# Patient Record
Sex: Male | Born: 1935 | Race: White | Hispanic: No | Marital: Married | Smoking: Never smoker
Health system: Southern US, Community
[De-identification: ages and names within clinical notes are randomized; demographics above are authoritative.]

## PROBLEM LIST (undated history)

## (undated) DIAGNOSIS — M1990 Unspecified osteoarthritis, unspecified site: Secondary | ICD-10-CM

## (undated) DIAGNOSIS — B019 Varicella without complication: Secondary | ICD-10-CM

## (undated) DIAGNOSIS — C44601 Unspecified malignant neoplasm of skin of unspecified upper limb, including shoulder: Secondary | ICD-10-CM

## (undated) DIAGNOSIS — R030 Elevated blood-pressure reading, without diagnosis of hypertension: Secondary | ICD-10-CM

## (undated) DIAGNOSIS — R51 Headache: Secondary | ICD-10-CM

## (undated) DIAGNOSIS — K635 Polyp of colon: Secondary | ICD-10-CM

## (undated) DIAGNOSIS — I10 Essential (primary) hypertension: Secondary | ICD-10-CM

## (undated) DIAGNOSIS — B029 Zoster without complications: Secondary | ICD-10-CM

## (undated) DIAGNOSIS — K219 Gastro-esophageal reflux disease without esophagitis: Secondary | ICD-10-CM

## (undated) HISTORY — DX: Varicella without complication: B01.9

## (undated) HISTORY — DX: Unspecified osteoarthritis, unspecified site: M19.90

## (undated) HISTORY — DX: Polyp of colon: K63.5

## (undated) HISTORY — DX: Gastro-esophageal reflux disease without esophagitis: K21.9

## (undated) HISTORY — DX: Unspecified malignant neoplasm of skin of unspecified upper limb, including shoulder: C44.601

## (undated) HISTORY — DX: Headache: R51

## (undated) HISTORY — DX: Zoster without complications: B02.9

## (undated) HISTORY — DX: Essential (primary) hypertension: I10

## (undated) HISTORY — DX: Elevated blood-pressure reading, without diagnosis of hypertension: R03.0

---

## 2004-04-29 ENCOUNTER — Ambulatory Visit: Payer: Self-pay | Admitting: Gastroenterology

## 2004-06-20 ENCOUNTER — Ambulatory Visit: Payer: Self-pay | Admitting: Gastroenterology

## 2004-06-24 ENCOUNTER — Ambulatory Visit: Payer: Self-pay | Admitting: Gastroenterology

## 2005-06-10 LAB — HM COLONOSCOPY

## 2006-05-04 ENCOUNTER — Ambulatory Visit: Payer: Self-pay | Admitting: Internal Medicine

## 2007-11-13 IMAGING — CT CT HEAD WITHOUT CONTRAST
2 series · 16 of 30 positions shown, 20 images · non-contrast
Comparison: none

REASON FOR EXAM: Confusion, dizziness and memory loss
COMMENTS:

[Series 2: without · axial · non-contrast · 0.43mm/px · z∈[+860,+986]mm · 13 of 31 slices shown, 17 images]
[im 3/31  brain]
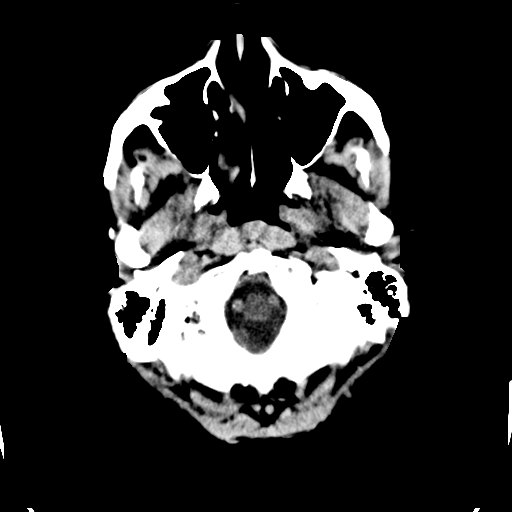
[im 3/31  bone]
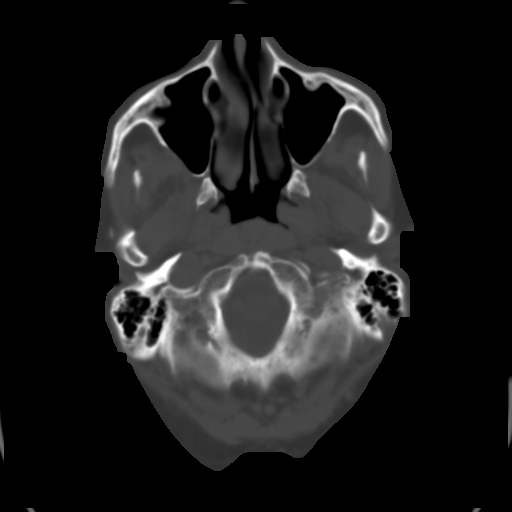
[im 5/31  brain]
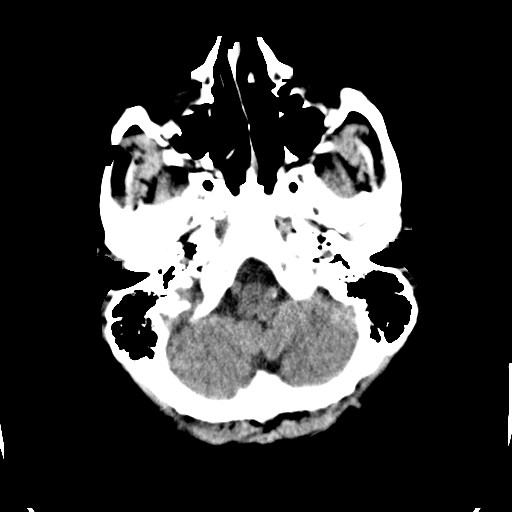
[im 7/31  brain]
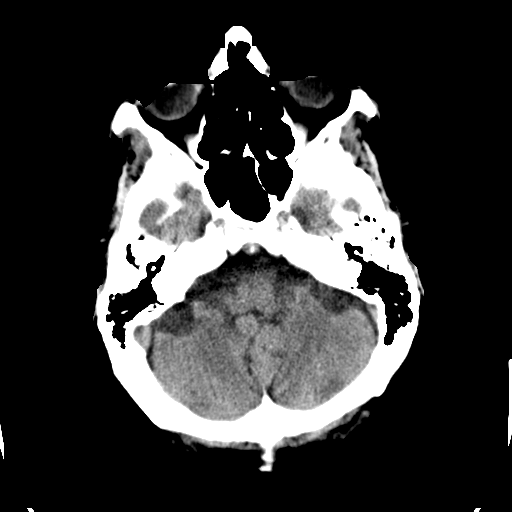
[im 9/31  brain]
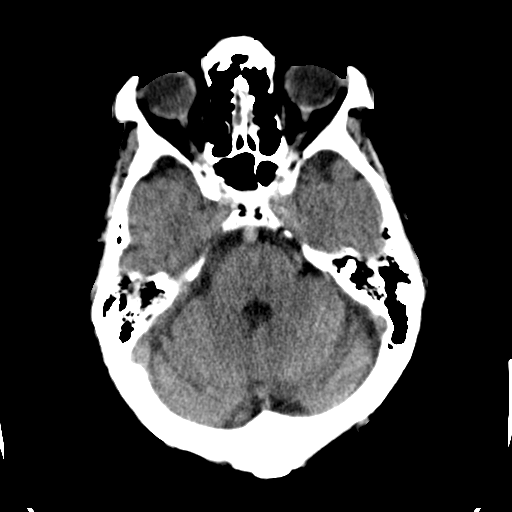
[im 11/31  brain]
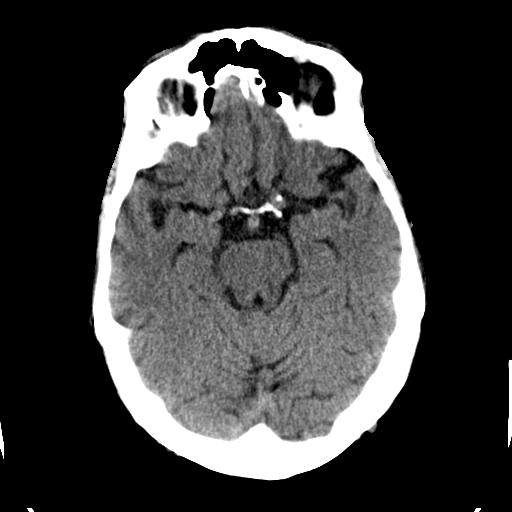
[im 11/31  bone]
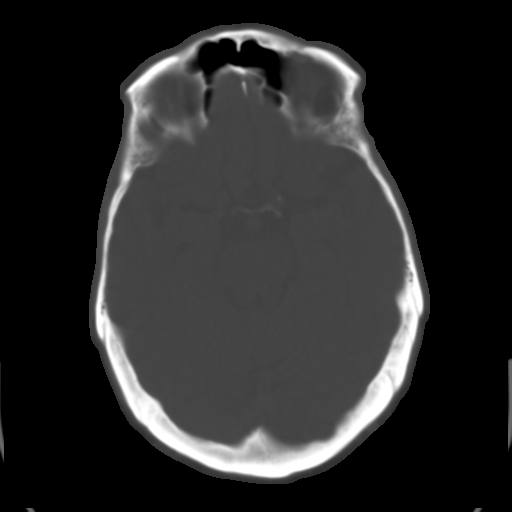
[im 13/31  brain]
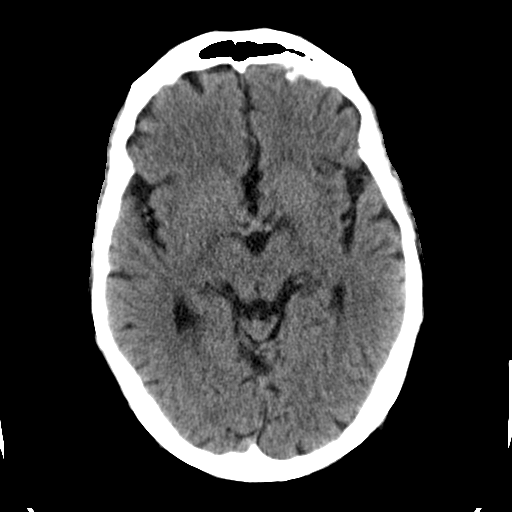
[im 16/31  brain]
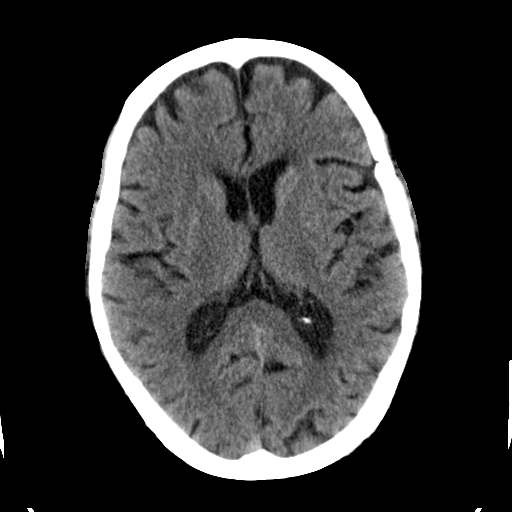
[im 18/31  brain]
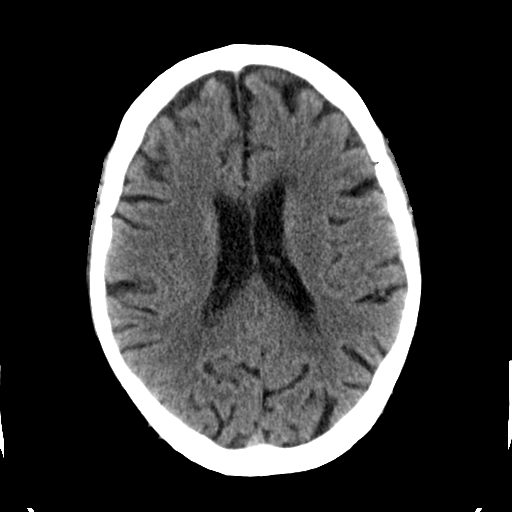
[im 20/31  brain]
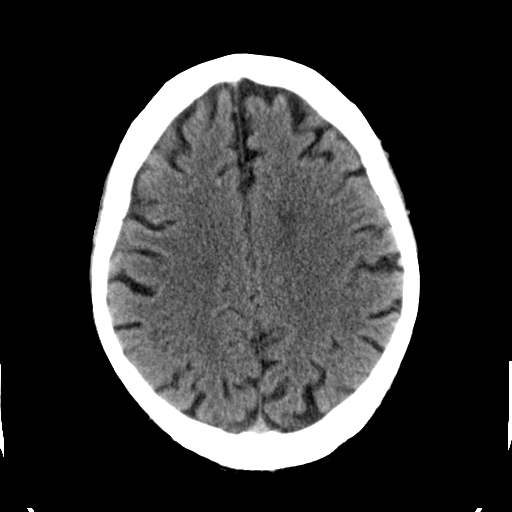
[im 20/31  bone]
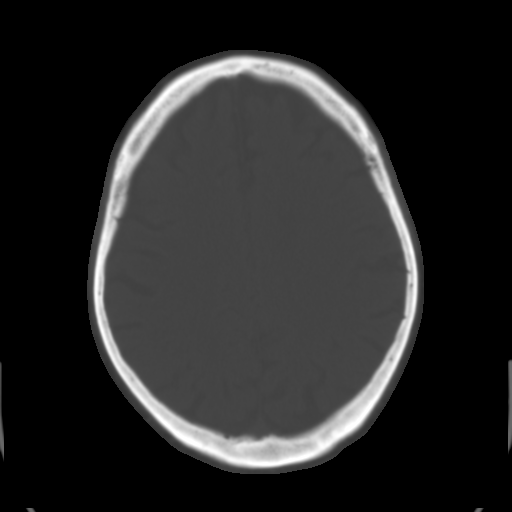
[im 22/31  brain]
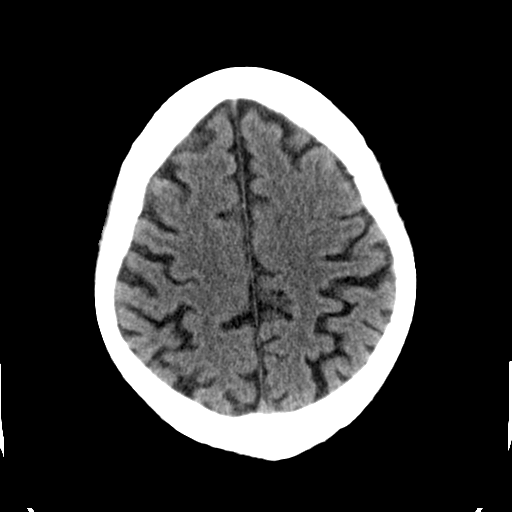
[im 24/31  brain]
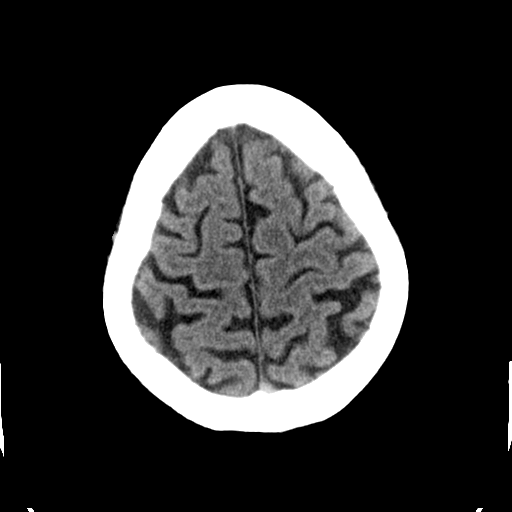
[im 26/31  brain]
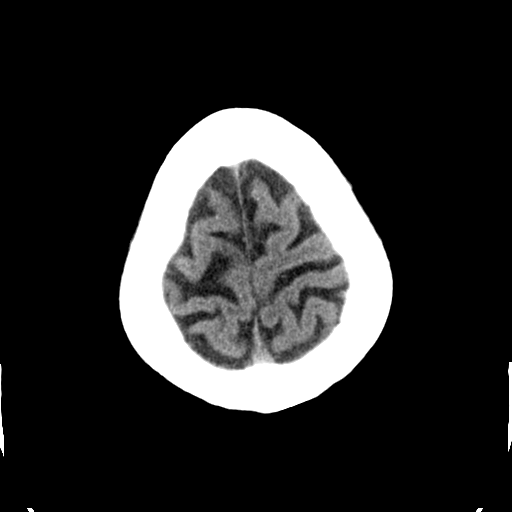
[im 28/31  brain]
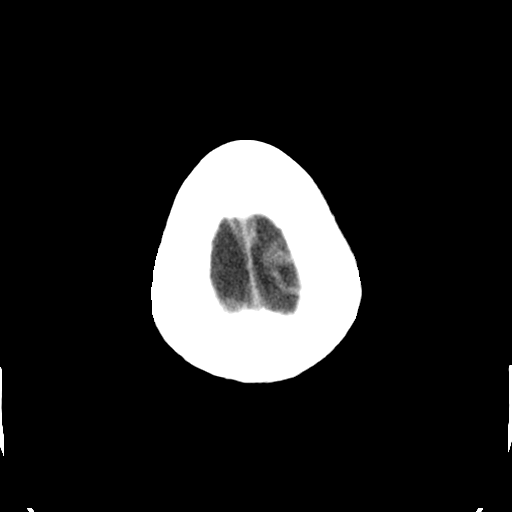
[im 28/31  bone]
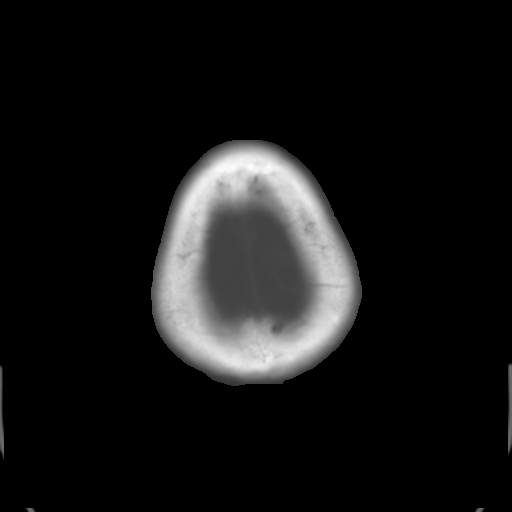

[Series 3: bone · axial · 0.43mm/px · z∈[+860,+900]mm · 3 of 31 slices shown]
[im 3/31  bone]
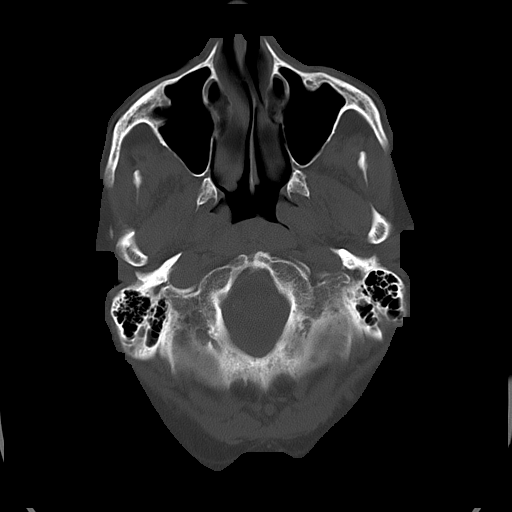
[im 7/31  bone]
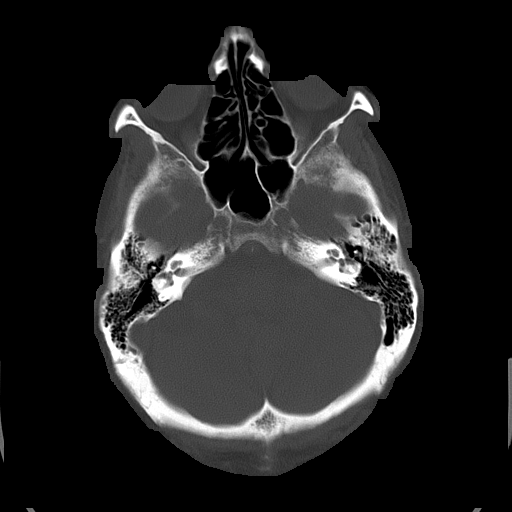
[im 11/31  bone]
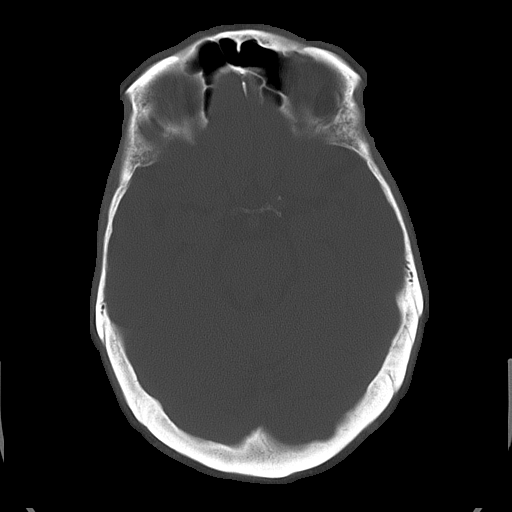

[16 of 30 positions shown; findings below may reference images not displayed]

PROCEDURE:     CT  - CT HEAD WITHOUT CONTRAST  - May 04, 2006  [DATE]

RESULT:      The patient is complaining of mental status change and memory
loss.

The ventricles are normal in size and position. There is moderate diffuse
cerebral and cerebellar atrophy present. Punctate basal ganglia
calcification is noted on the right. There is no evidence of intracranial
hemorrhage or acute evolving ischemic infarction. At bone window settings, I
see no lytic or blastic lesion. The observed portions of the paranasal
sinuses are clear.
IMPRESSION: 1.     I do not see evidence of an acute ischemic or hemorrhagic event.
2.     There are mild age-appropriate atrophic changes present.

## 2012-05-24 ENCOUNTER — Telehealth: Payer: Self-pay | Admitting: Internal Medicine

## 2012-05-24 NOTE — Telephone Encounter (Signed)
Patient wanting a prescription for a pneumonia vaccine faxed to [facility] on [address]. °

## 2012-05-24 NOTE — Telephone Encounter (Signed)
Patient notified . He stated that he will get it when he come for his visit in two weeks.

## 2012-05-24 NOTE — Telephone Encounter (Signed)
I HAVE NOT MET THIS PATIENT YET,  CANNO PRESCRIBE ANYTHING UNTIL HE HAS BEEN SEEN.

## 2012-06-07 ENCOUNTER — Encounter: Payer: Self-pay | Admitting: Internal Medicine

## 2012-06-07 ENCOUNTER — Ambulatory Visit (INDEPENDENT_AMBULATORY_CARE_PROVIDER_SITE_OTHER): Payer: Medicare Other | Admitting: Internal Medicine

## 2012-06-07 VITALS — BP 130/78 | HR 69 | Temp 97.7°F | Resp 12 | Ht 69.0 in | Wt 182.8 lb

## 2012-06-07 DIAGNOSIS — I10 Essential (primary) hypertension: Secondary | ICD-10-CM

## 2012-06-07 DIAGNOSIS — F411 Generalized anxiety disorder: Secondary | ICD-10-CM

## 2012-06-07 DIAGNOSIS — M1990 Unspecified osteoarthritis, unspecified site: Secondary | ICD-10-CM

## 2012-06-07 DIAGNOSIS — M129 Arthropathy, unspecified: Secondary | ICD-10-CM

## 2012-06-07 DIAGNOSIS — K219 Gastro-esophageal reflux disease without esophagitis: Secondary | ICD-10-CM

## 2012-06-07 MED ORDER — ALPRAZOLAM 0.25 MG PO TABS: 0.1250 mg | ORAL_TABLET | Freq: Two times a day (BID) | ORAL | Status: DC | PRN

## 2012-06-07 MED ORDER — HYDROCHLOROTHIAZIDE 25 MG PO TABS: 25.0000 mg | ORAL_TABLET | Freq: Every day | ORAL | Status: DC

## 2012-06-07 NOTE — Progress Notes (Signed)
Patient ID: Matthew Maxwell, male   DOB: 1920/04/08, 78 y.o.   MRN: [id number]     Patient Active Problem List  Diagnosis  . Hypertension  . Arthritis  . Skin cancer of arm  . GERD (gastroesophageal reflux disease)  . Generalized anxiety disorder    Subjective:  CC:   Chief Complaint  Patient presents with  . Establish Care    HPI:   Matthew Maxwell is a 78 y.o. male who presents as a new patient to establish primary care with the chief complaint of need for new physician .  He is a very healthy 78 year old male who is transferring from Dr Diona Fanti  Who has retired a year ago.  He has no acute issues.  Hees podiatrist  [place] for management of dystrophic toe nails.  He has chronic knee pain secondary to DJD and has had prior knee aspiration and injections with Synvisc both knees by Dr. Gavin Potters .  He has has a heart murmur which is nonprogressive and asymptomatic.  He has some hearing loss 0 to read lips been followed cauterizations easily. He has occasional headaches and reflux. His hypertension which is managed with minimal medications. He has a history of colon polyps. 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 He is accompanied by his wife and daughter. His wife has noticed very mild memory loss for short term items but he remains extremely functional and still manages the household finances without problem.   Past Medical History  Diagnosis Date  . Chicken pox   . Headache   . Elevated blood pressure reading   . Colon polyps   . Shingles   . Hypertension   . Arthritis   . Skin cancer of arm   . GERD (gastroesophageal reflux disease)     History reviewed. No pertinent past surgical history.  Family History  Problem Relation Age of Onset  . Hypertension Mother   . Heart disease Father     History   Social History  . Marital Status: Married    Spouse Name: N/A    Number of Children: N/A  . Years of Education: N/A   Occupational History  .  Not on file.   Social History Main Topics  . Smoking status: Never Smoker   . Smokeless tobacco: Not on file  . Alcohol Use: No  . Drug Use: Not on file  . Sexually Active: Not on file   Other Topics Concern  . Not on file   Social History Narrative  . No narrative on file    No Known Allergies   Review of Systems:   The remainder of the review of systems was negative except those addressed in the HPI.    Objective:  BP 130/78  Pulse 69  Temp 97.7 F (36.5 C) (Oral)  Resp 12  Ht 5\' 9"  (1.753 m)  Wt 182 lb 12 oz (82.895 kg)  BMI 26.99 kg/m2  SpO2 95%  General appearance: alert, cooperative and appears stated age Ears: normal TM's and external ear canals both ears Throat: lips, mucosa, and tongue normal; teeth and gums normal Neck: no adenopathy, no carotid bruit, supple, symmetrical, trachea midline and thyroid not enlarged, symmetric, no tenderness/mass/nodules Back: symmetric, no curvature. ROM normal. No CVA tenderness. Lungs: clear to auscultation bilaterally Heart: regular rate and rhythm, S1, S2 normal, no murmur, click, rub or gallop Abdomen: soft, non-tender; bowel sounds normal; no masses,  no organomegaly Pulses: 2+ and  symmetric Skin: Skin color, texture, turgor normal. No rashes or lesions Lymph nodes: Cervical, supraclavicular, and axillary nodes normal.  Assessment and Plan:  Hypertension Well controlled on current regimen. Renal function and electrolytes are going to be checked at the [facility] next week and they will send me copies.  no changes today.  GERD (gastroesophageal reflux disease) Symptoms are controlled with daily use of omeprazole.  Arthritis Symptoms are minimal and managed with over-the-counter Tylenol as needed. He walks with a walking stick and has had no recent history of falls or weakness.  Generalized anxiety disorder Managed with stable doses of venlafaxine and when necessary low dose alprazolam for years. No changes  today.   Updated Medication List Outpatient Encounter Prescriptions as of 06/07/2012  Medication Sig Dispense Refill  . ALPRAZolam (XANAX) 0.25 MG tablet Take 0.5 tablets (0.125 mg total) by mouth 2 (two) times daily as needed.  30 tablet  5  . hydrochlorothiazide (HYDRODIURIL) 25 MG tablet Take 1 tablet (25 mg total) by mouth daily.  90 tablet  3  . omeprazole (PRILOSEC) 20 MG capsule Take 20 mg by mouth daily.      Marland Kitchen venlafaxine (EFFEXOR) 75 MG tablet Take 75 mg by mouth 2 (two) times daily.      . [DISCONTINUED] ALPRAZolam (XANAX) 0.25 MG tablet Take 0.125 mg by mouth 2 (two) times daily as needed.       . [DISCONTINUED] hydrochlorothiazide (HYDRODIURIL) 25 MG tablet Take 25 mg by mouth daily.         No orders of the defined types were placed in this encounter.    No Follow-up on file.

## 2012-06-07 NOTE — Patient Instructions (Addendum)
Please have thyroid,  Liver and kidney function checked at the [place] and send to me.    We will fax to [facility] your prescriptions

## 2012-06-10 ENCOUNTER — Encounter: Payer: Self-pay | Admitting: Internal Medicine

## 2012-06-10 DIAGNOSIS — F411 Generalized anxiety disorder: Secondary | ICD-10-CM | POA: Insufficient documentation

## 2012-06-10 DIAGNOSIS — K219 Gastro-esophageal reflux disease without esophagitis: Secondary | ICD-10-CM | POA: Insufficient documentation

## 2012-06-10 DIAGNOSIS — C44601 Unspecified malignant neoplasm of skin of unspecified upper limb, including shoulder: Secondary | ICD-10-CM | POA: Insufficient documentation

## 2012-06-10 DIAGNOSIS — I10 Essential (primary) hypertension: Secondary | ICD-10-CM | POA: Insufficient documentation

## 2012-06-10 DIAGNOSIS — M1990 Unspecified osteoarthritis, unspecified site: Secondary | ICD-10-CM | POA: Insufficient documentation

## 2012-06-10 NOTE — Assessment & Plan Note (Signed)
Symptoms are minimal and managed with over-the-counter Tylenol as needed. He walks with a walking stick and has had no recent history of falls or weakness.

## 2012-06-10 NOTE — Assessment & Plan Note (Signed)
Symptoms are controlled with daily use of omeprazole.

## 2012-06-10 NOTE — Assessment & Plan Note (Signed)
Managed with stable doses of venlafaxine and when necessary low dose alprazolam for years. No changes today.

## 2012-06-10 NOTE — Assessment & Plan Note (Signed)
Well controlled on current regimen. Renal function intellectual H. need to be checked.  no changes today.

## 2012-09-21 ENCOUNTER — Telehealth: Payer: Self-pay | Admitting: Internal Medicine

## 2012-09-21 NOTE — Telephone Encounter (Signed)
FYI Spouse called wanting to make an appointment with dr Darrick Huntsman for Matthew Maxwell She stated when he was doing PT therapist thought he heard irregular heart beat.  pt spouse refused to talk to triage nurse/rbh Made appointment appointment with raquel rey Friday 2/28 @ 1:30

## 2012-09-23 ENCOUNTER — Ambulatory Visit (INDEPENDENT_AMBULATORY_CARE_PROVIDER_SITE_OTHER): Payer: Medicare Other | Admitting: Adult Health

## 2012-09-23 ENCOUNTER — Encounter: Payer: Self-pay | Admitting: Adult Health

## 2012-09-23 VITALS — BP 127/64 | HR 81 | Temp 98.1°F | Resp 14 | Ht 67.0 in | Wt 189.0 lb

## 2012-09-23 DIAGNOSIS — I499 Cardiac arrhythmia, unspecified: Secondary | ICD-10-CM | POA: Insufficient documentation

## 2012-09-23 NOTE — Assessment & Plan Note (Addendum)
EKG done.  Patient is in sinus rhythm with PACs. Leads III and aVF showing inverted T waves. No acute changes. Patient is asymptomatic. Instructed to report any CP, shortness of breath, syncope or other bothersome symptoms.

## 2012-09-23 NOTE — Progress Notes (Signed)
  Subjective:    Patient ID: Matthew Maxwell, male    DOB: 1919-11-25, 79 y.o.   MRN: [id number]  HPI  Patient is a pleasant 79 y/o male who presents to clinic for irregular heart beat. Patient's wife reports that pt saw a provider at the [place] who ordered physical therapy for evaluation and treatment of patient's balance. Wife further reports that she really does not understand why physical therapy was ordered in the first place. Patient has been doing fine. She reports that during the last visit provided by his physical therapist patient was noted to have an irregular heartbeat. Patient is here today for evaluation of same. Patient denies any symptoms. He denies cp, shortness of breath, syncope, lightheadedness. He has chronic problems with dizzy spells but he reports these have been ongoing since his early adult years. Patient and wife are both very jovial during the entire visit. Patient even comments "are you trying to find something wrong with me?"   Current Outpatient Prescriptions on File Prior to Visit  Medication Sig Dispense Refill  . hydrochlorothiazide (HYDRODIURIL) 25 MG tablet Take 1 tablet (25 mg total) by mouth daily.  90 tablet  3  . omeprazole (PRILOSEC) 20 MG capsule Take 20 mg by mouth daily.      Marland Kitchen venlafaxine (EFFEXOR) 75 MG tablet Take 75 mg by mouth 2 (two) times daily.      Marland Kitchen ALPRAZolam (XANAX) 0.25 MG tablet Take 0.5 tablets (0.125 mg total) by mouth 2 (two) times daily as needed.  30 tablet  5   No current facility-administered medications on file prior to visit.     Review of Systems  HENT: Ear pain: .vs.   Respiratory: Negative for chest tightness, shortness of breath and wheezing.   Cardiovascular: Negative for chest pain, palpitations and leg swelling.  Neurological: Positive for dizziness. Negative for syncope, light-headedness, numbness and headaches.  Psychiatric/Behavioral: Negative.     BP 127/64  Pulse 81  Temp(Src) 98.1 F (36.7 C) (Oral)  Resp 14  Ht  5\' 7"  (1.702 m)  Wt 189 lb (85.73 kg)  BMI 29.59 kg/m2  SpO2 98%     Objective:   Physical Exam  Constitutional: He is oriented to person, place, and time. He appears well-developed and well-nourished. No distress.  Cardiovascular: Normal rate and intact distal pulses.   Murmur heard. Irregular rhythm.  Pulmonary/Chest: Effort normal and breath sounds normal. No respiratory distress. He has no wheezes. He has no rales.  Neurological: He is alert and oriented to person, place, and time.  Psychiatric: He has a normal mood and affect. His behavior is normal. Judgment and thought content normal.          Assessment & Plan:

## 2012-12-09 ENCOUNTER — Ambulatory Visit: Payer: Medicare Other | Admitting: Internal Medicine

## 2012-12-14 ENCOUNTER — Ambulatory Visit: Payer: Medicare Other | Admitting: Internal Medicine

## 2014-04-16 ENCOUNTER — Ambulatory Visit: Payer: Self-pay | Admitting: Otolaryngology

## 2014-06-13 ENCOUNTER — Ambulatory Visit: Payer: Self-pay | Admitting: Ophthalmology

## 2014-06-13 LAB — POTASSIUM: Potassium: 4.5 mmol/L (ref 3.5–5.1)

## 2014-07-03 ENCOUNTER — Ambulatory Visit: Payer: Self-pay | Admitting: Ophthalmology

## 2014-07-24 ENCOUNTER — Ambulatory Visit: Payer: Self-pay | Admitting: Ophthalmology

## 2014-11-17 NOTE — Op Note (Signed)
PATIENT NAME:  Matthew Maxwell, Matthew Maxwell MR#:  [id number] DATE OF BIRTH:  Apr 10, 1920  DATE OF PROCEDURE:  07/03/2014  PREOPERATIVE DIAGNOSIS:  Nuclear sclerotic cataract of the right eye.   POSTOPERATIVE DIAGNOSIS:  Nuclear sclerotic cataract of the right eye.   OPERATIVE PROCEDURE:  Cataract extraction by phacoemulsification with implant of intraocular lens to right eye.   SURGEON:  Birder Robson, MD.   ANESTHESIA:  1. Managed anesthesia care.  2. Topical tetracaine drops followed by 2% Xylocaine jelly applied in the preoperative holding area.   COMPLICATIONS:  None.   TECHNIQUE:   Stop and chop.  DESCRIPTION OF PROCEDURE:  The patient was examined and consented in the preoperative holding area where the aforementioned topical anesthesia was applied to the right eye and then brought back to the Operating Room where the right eye was prepped and draped in the usual sterile ophthalmic fashion and a lid speculum was placed. A paracentesis was created with the side port blade and the anterior chamber was filled with viscoelastic. A near clear corneal incision was performed with the steel keratome. A continuous curvilinear capsulorrhexis was performed with a cystotome followed by the capsulorrhexis forceps. Hydrodissection and hydrodelineation were carried out with BSS on a blunt cannula. The lens was removed in a stop and chop technique and the remaining cortical material was removed with the irrigation-aspiration handpiece. The capsular bag was inflated with viscoelastic and the Tecnis ZCB00, 20.5-diopter lens, serial number [id number] was placed in the capsular bag without complication. The remaining viscoelastic was removed from the eye with the irrigation-aspiration handpiece. The wounds were hydrated. The anterior chamber was flushed with Miostat and the eye was inflated to physiologic pressure. 0.1 mL of cefuroxime concentration 10 mg/mL was placed in the anterior chamber. The wounds were found to be  water tight. The eye was dressed with Vigamox. The patient was given protective glasses to wear throughout the day and a shield with which to sleep tonight. The patient was also given drops with which to begin a drop regimen today and will follow-up with me in one day.  TOTAL ULTRASOUND TIME: Three minutes 24 seconds with a CDE of 52.13.   ____________________________ Livingston Diones. Dayne Chait, MD wlp:TT D: 07/03/2014 21:22:19 ET T: 07/03/2014 23:30:14 ET JOB#: [id number]  cc: Nichalos L. Atalia Litzinger, MD, <Dictator> Livingston Diones Murphy Duzan MD ELECTRONICALLY SIGNED 07/05/2014 10:37

## 2014-11-21 NOTE — Op Note (Signed)
PATIENT NAME:  Matthew Maxwell, Matthew Maxwell MR#:  [id number] DATE OF BIRTH:  June 14, 1920  DATE OF PROCEDURE:  07/24/2014  PREOPERATIVE DIAGNOSIS:  Nuclear sclerotic cataract of the left eye.   POSTOPERATIVE DIAGNOSIS:  Nuclear sclerotic cataract of the eye.   OPERATIVE PROCEDURE:  Cataract extraction by phacoemulsification with implant of intraocular lens to eye.   SURGEON:  Birder Robson, MD.   ANESTHESIA:  1. Managed anesthesia care.  2. Topical tetracaine drops followed by 2% Xylocaine jelly applied in the preoperative holding area.   COMPLICATIONS:  None.   TECHNIQUE:   Stop and chop.  DESCRIPTION OF PROCEDURE:  The patient was examined and consented in the preoperative holding area where the aforementioned topical anesthesia was applied to the left eye and then brought back to the Operating Room where the left eye was prepped and draped in the usual sterile ophthalmic fashion and a lid speculum was placed. A paracentesis was created with the side port blade and the anterior chamber was filled with viscoelastic. A near clear corneal incision was performed with the steel keratome. A continuous curvilinear capsulorrhexis was performed with a cystotome followed by the capsulorrhexis forceps. Hydrodissection and hydrodelineation were carried out with BSS on a blunt cannula. The lens was removed in a stop and chop technique and the remaining cortical material was removed with the irrigation-aspiration handpiece. The capsular bag was inflated with viscoelastic and the Tecnis ZCB00, 20.0-diopter lens, serial number [id number] was placed in the capsular bag without complication. The remaining viscoelastic was removed from the eye with the irrigation-aspiration handpiece. The wounds were hydrated. The anterior chamber was flushed with Miostat and the eye was inflated to physiologic pressure. 0.1 mL of cefuroxime concentration 10 mg/mL was placed in the anterior chamber. The wounds were found to be water tight.  The eye was dressed with Vigamox. The patient was given protective glasses to wear throughout the day and a shield with which to sleep tonight. The patient was also given drops with which to begin a drop regimen today and will follow-up with me in one day.    ____________________________ Livingston Diones. Khrystian Schauf, MD wlp:at D: 07/24/2014 13:54:42 ET T: 07/24/2014 15:33:22 ET JOB#: [id number]  cc: Jaelynn L. Kidada Ging, MD, <Dictator> Livingston Diones Antoin Dargis MD ELECTRONICALLY SIGNED 07/30/2014 9:18

## 2021-02-23 ENCOUNTER — Emergency Department: Payer: Medicare Other

## 2021-02-23 ENCOUNTER — Other Ambulatory Visit: Payer: Self-pay

## 2021-02-23 ENCOUNTER — Inpatient Hospital Stay: Payer: Medicare Other

## 2021-02-23 DIAGNOSIS — J9601 Acute respiratory failure with hypoxia: Secondary | ICD-10-CM | POA: Diagnosis not present

## 2021-02-23 DIAGNOSIS — I214 Non-ST elevation (NSTEMI) myocardial infarction: Principal | ICD-10-CM

## 2021-02-23 DIAGNOSIS — F0391 Unspecified dementia with behavioral disturbance: Secondary | ICD-10-CM | POA: Diagnosis present

## 2021-02-23 DIAGNOSIS — R945 Abnormal results of liver function studies: Secondary | ICD-10-CM

## 2021-02-23 DIAGNOSIS — Z8249 Family history of ischemic heart disease and other diseases of the circulatory system: Secondary | ICD-10-CM | POA: Diagnosis not present

## 2021-02-23 DIAGNOSIS — N179 Acute kidney failure, unspecified: Secondary | ICD-10-CM

## 2021-02-23 DIAGNOSIS — Z8719 Personal history of other diseases of the digestive system: Secondary | ICD-10-CM

## 2021-02-23 DIAGNOSIS — I4891 Unspecified atrial fibrillation: Secondary | ICD-10-CM

## 2021-02-23 DIAGNOSIS — F418 Other specified anxiety disorders: Secondary | ICD-10-CM | POA: Diagnosis present

## 2021-02-23 DIAGNOSIS — Z85828 Personal history of other malignant neoplasm of skin: Secondary | ICD-10-CM

## 2021-02-23 DIAGNOSIS — Z66 Do not resuscitate: Secondary | ICD-10-CM | POA: Diagnosis present

## 2021-02-23 DIAGNOSIS — E860 Dehydration: Secondary | ICD-10-CM | POA: Diagnosis present

## 2021-02-23 DIAGNOSIS — D72829 Elevated white blood cell count, unspecified: Secondary | ICD-10-CM | POA: Diagnosis present

## 2021-02-23 DIAGNOSIS — W1830XA Fall on same level, unspecified, initial encounter: Secondary | ICD-10-CM | POA: Diagnosis present

## 2021-02-23 DIAGNOSIS — I10 Essential (primary) hypertension: Secondary | ICD-10-CM

## 2021-02-23 DIAGNOSIS — R7989 Other specified abnormal findings of blood chemistry: Secondary | ICD-10-CM | POA: Diagnosis present

## 2021-02-23 DIAGNOSIS — K219 Gastro-esophageal reflux disease without esophagitis: Secondary | ICD-10-CM

## 2021-02-23 DIAGNOSIS — I5021 Acute systolic (congestive) heart failure: Secondary | ICD-10-CM | POA: Diagnosis present

## 2021-02-23 DIAGNOSIS — M1990 Unspecified osteoarthritis, unspecified site: Secondary | ICD-10-CM | POA: Diagnosis present

## 2021-02-23 DIAGNOSIS — Z7982 Long term (current) use of aspirin: Secondary | ICD-10-CM | POA: Diagnosis not present

## 2021-02-23 DIAGNOSIS — R0602 Shortness of breath: Secondary | ICD-10-CM

## 2021-02-23 DIAGNOSIS — U071 COVID-19: Secondary | ICD-10-CM | POA: Diagnosis present

## 2021-02-23 DIAGNOSIS — G9341 Metabolic encephalopathy: Secondary | ICD-10-CM | POA: Diagnosis present

## 2021-02-23 DIAGNOSIS — E785 Hyperlipidemia, unspecified: Secondary | ICD-10-CM | POA: Diagnosis present

## 2021-02-23 DIAGNOSIS — F32A Depression, unspecified: Secondary | ICD-10-CM | POA: Diagnosis present

## 2021-02-23 DIAGNOSIS — J690 Pneumonitis due to inhalation of food and vomit: Secondary | ICD-10-CM | POA: Diagnosis not present

## 2021-02-23 DIAGNOSIS — Z79890 Hormone replacement therapy (postmenopausal): Secondary | ICD-10-CM

## 2021-02-23 DIAGNOSIS — F411 Generalized anxiety disorder: Secondary | ICD-10-CM | POA: Diagnosis present

## 2021-02-23 DIAGNOSIS — N1831 Chronic kidney disease, stage 3a: Secondary | ICD-10-CM | POA: Diagnosis present

## 2021-02-23 DIAGNOSIS — I255 Ischemic cardiomyopathy: Secondary | ICD-10-CM | POA: Diagnosis present

## 2021-02-23 DIAGNOSIS — Z515 Encounter for palliative care: Secondary | ICD-10-CM

## 2021-02-23 DIAGNOSIS — R0681 Apnea, not elsewhere classified: Secondary | ICD-10-CM | POA: Diagnosis present

## 2021-02-23 DIAGNOSIS — Z885 Allergy status to narcotic agent status: Secondary | ICD-10-CM

## 2021-02-23 DIAGNOSIS — I471 Supraventricular tachycardia: Secondary | ICD-10-CM | POA: Diagnosis not present

## 2021-02-23 DIAGNOSIS — E039 Hypothyroidism, unspecified: Secondary | ICD-10-CM

## 2021-02-23 DIAGNOSIS — Z79899 Other long term (current) drug therapy: Secondary | ICD-10-CM

## 2021-02-23 DIAGNOSIS — I130 Hypertensive heart and chronic kidney disease with heart failure and stage 1 through stage 4 chronic kidney disease, or unspecified chronic kidney disease: Secondary | ICD-10-CM | POA: Diagnosis present

## 2021-02-23 DIAGNOSIS — W19XXXA Unspecified fall, initial encounter: Secondary | ICD-10-CM | POA: Diagnosis not present

## 2021-02-23 DIAGNOSIS — R112 Nausea with vomiting, unspecified: Secondary | ICD-10-CM | POA: Diagnosis present

## 2021-02-23 DIAGNOSIS — Z888 Allergy status to other drugs, medicaments and biological substances status: Secondary | ICD-10-CM

## 2021-02-23 LAB — COMPREHENSIVE METABOLIC PANEL
ALT: 106 U/L — ABNORMAL HIGH (ref 0–44)
AST: 145 U/L — ABNORMAL HIGH (ref 15–41)
Albumin: 3.5 g/dL (ref 3.5–5.0)
Alkaline Phosphatase: 71 U/L (ref 38–126)
Anion gap: 15 (ref 5–15)
BUN: 56 mg/dL — ABNORMAL HIGH (ref 8–23)
CO2: 21 mmol/L — ABNORMAL LOW (ref 22–32)
Calcium: 8.9 mg/dL (ref 8.9–10.3)
Chloride: 105 mmol/L (ref 98–111)
Creatinine, Ser: 2.11 mg/dL — ABNORMAL HIGH (ref 0.61–1.24)
GFR, Estimated: 28 mL/min — ABNORMAL LOW (ref >60)
Glucose, Bld: 136 mg/dL — ABNORMAL HIGH (ref 70–99)
Potassium: 5 mmol/L (ref 3.5–5.1)
Sodium: 141 mmol/L (ref 135–145)
Total Bilirubin: 2.2 mg/dL — ABNORMAL HIGH (ref 0.3–1.2)
Total Protein: 6.6 g/dL (ref 6.5–8.1)

## 2021-02-23 LAB — BRAIN NATRIURETIC PEPTIDE: B Natriuretic Peptide: 2706 pg/mL — ABNORMAL HIGH (ref 0.0–100.0)

## 2021-02-23 LAB — PROTIME-INR
INR: 1.4 — ABNORMAL HIGH (ref 0.8–1.2)
Prothrombin Time: 17.3 s — ABNORMAL HIGH (ref 11.4–15.2)

## 2021-02-23 LAB — RESP PANEL BY RT-PCR (FLU A&B, COVID) ARPGX2
Influenza A by PCR: NEGATIVE
Influenza B by PCR: NEGATIVE
SARS Coronavirus 2 by RT PCR: POSITIVE — AB

## 2021-02-23 LAB — TROPONIN I (HIGH SENSITIVITY)
Troponin I (High Sensitivity): 1589 ng/L (ref <18)
Troponin I (High Sensitivity): 1815 ng/L (ref <18)
Troponin I (High Sensitivity): 2780 ng/L

## 2021-02-23 LAB — APTT: aPTT: 29 seconds (ref 24–36)

## 2021-02-23 LAB — CBC
HCT: 37.5 % — ABNORMAL LOW (ref 39.0–52.0)
Hemoglobin: 12.2 g/dL — ABNORMAL LOW (ref 13.0–17.0)
MCH: 32.9 pg (ref 26.0–34.0)
MCHC: 32.5 g/dL (ref 30.0–36.0)
MCV: 101.1 fL — ABNORMAL HIGH (ref 80.0–100.0)
Platelets: 206 10*3/uL (ref 150–400)
RBC: 3.71 MIL/uL — ABNORMAL LOW (ref 4.22–5.81)
RDW: 14.6 % (ref 11.5–15.5)
WBC: 11.3 10*3/uL — ABNORMAL HIGH (ref 4.0–10.5)
nRBC: 0.9 % — ABNORMAL HIGH (ref 0.0–0.2)

## 2021-02-23 LAB — HEPARIN LEVEL (UNFRACTIONATED): Heparin Unfractionated: <0.1 [IU]/mL — ABNORMAL LOW (ref 0.30–0.70)

## 2021-02-23 LAB — AMMONIA: Ammonia: 19 umol/L (ref 9–35)

## 2021-02-23 LAB — HEMOGLOBIN A1C
Hgb A1c MFr Bld: 5.9 % — ABNORMAL HIGH (ref 4.8–5.6)
Mean Plasma Glucose: 122.63 mg/dL

## 2021-02-23 LAB — MAGNESIUM: Magnesium: 2.7 mg/dL — ABNORMAL HIGH (ref 1.7–2.4)

## 2021-02-23 MED ORDER — IRON-VITAMIN C 65-125 MG PO TABS: ORAL_TABLET | Freq: Every day | ORAL | Status: DC

## 2021-02-23 MED ORDER — HEPARIN (PORCINE) 25000 UT/250ML-% IV SOLN
1100.0000 [IU]/h | INTRAVENOUS | Status: DC
  Administered 2021-02-23: 900 [IU]/h via INTRAVENOUS
  Filled 2021-02-23: qty 250

## 2021-02-23 MED ORDER — FUROSEMIDE 10 MG/ML IJ SOLN
40.0000 mg | Freq: Once | INTRAMUSCULAR | Status: AC
  Administered 2021-02-23: 40 mg via INTRAVENOUS
  Filled 2021-02-23: qty 4

## 2021-02-23 MED ORDER — LORAZEPAM 2 MG/ML IJ SOLN
INTRAMUSCULAR | Status: AC
  Administered 2021-02-23: 2 mg via INTRAMUSCULAR
  Filled 2021-02-23: qty 1

## 2021-02-23 MED ORDER — VITAMIN D3 25 MCG (1000 UNIT) PO TABS
2000.0000 [IU] | ORAL_TABLET | Freq: Every day | ORAL | Status: DC
  Filled 2021-02-23 (×3): qty 2

## 2021-02-23 MED ORDER — LEVOTHYROXINE SODIUM 50 MCG PO TABS: 75.0000 ug | ORAL_TABLET | Freq: Every day | ORAL | Status: DC

## 2021-02-23 MED ORDER — B COMPLEX-C PO TABS
1.0000 | ORAL_TABLET | Freq: Every day | ORAL | Status: DC
  Filled 2021-02-23 (×3): qty 1

## 2021-02-23 MED ORDER — DM-GUAIFENESIN ER 30-600 MG PO TB12
1.0000 | ORAL_TABLET | Freq: Two times a day (BID) | ORAL | Status: DC | PRN
  Administered 2021-02-23: 1 via ORAL
  Filled 2021-02-23: qty 1

## 2021-02-23 MED ORDER — B COMPLEX VITAMINS PO CAPS: 1.0000 | ORAL_CAPSULE | Freq: Every day | ORAL | Status: DC

## 2021-02-23 MED ORDER — KETOCONAZOLE 2 % EX SHAM
1.0000 "application " | MEDICATED_SHAMPOO | CUTANEOUS | Status: DC
  Filled 2021-02-23: qty 120

## 2021-02-23 MED ORDER — FERROUS SULFATE 325 (65 FE) MG PO TABS: 325.0000 mg | ORAL_TABLET | Freq: Every day | ORAL | Status: DC

## 2021-02-23 MED ORDER — CYANOCOBALAMIN 500 MCG PO LOZG: 500.0000 ug | LOZENGE | Freq: Every day | ORAL | Status: DC

## 2021-02-23 MED ORDER — MIRTAZAPINE 15 MG PO TABS
15.0000 mg | ORAL_TABLET | Freq: Every day | ORAL | Status: DC
  Administered 2021-02-23: 15 mg via ORAL
  Filled 2021-02-23: qty 1

## 2021-02-23 MED ORDER — HYDROXYZINE HCL 50 MG/ML IM SOLN
25.0000 mg | Freq: Four times a day (QID) | INTRAMUSCULAR | Status: DC | PRN
  Administered 2021-02-23: 25 mg via INTRAMUSCULAR
  Filled 2021-02-23 (×3): qty 0.5

## 2021-02-23 MED ORDER — SALINE SPRAY 0.65 % NA SOLN
2.0000 | Freq: Three times a day (TID) | NASAL | Status: DC
  Administered 2021-02-25: 2 via NASAL
  Filled 2021-02-23: qty 44

## 2021-02-23 MED ORDER — ZINC SULFATE 220 (50 ZN) MG PO CAPS: 440.0000 mg | ORAL_CAPSULE | Freq: Every day | ORAL | Status: DC

## 2021-02-23 MED ORDER — LACTATED RINGERS IV BOLUS
1000.0000 mL | Freq: Once | INTRAVENOUS | Status: AC
  Administered 2021-02-23: 1000 mL via INTRAVENOUS

## 2021-02-23 MED ORDER — POLYETHYL GLYCOL-PROPYL GLYCOL 0.4-0.3 % OP SOLN: 1.0000 [drp] | Freq: Every day | OPHTHALMIC | Status: DC

## 2021-02-23 MED ORDER — ATORVASTATIN CALCIUM 20 MG PO TABS
40.0000 mg | ORAL_TABLET | Freq: Every day | ORAL | Status: DC
  Administered 2021-02-23: 40 mg via ORAL
  Filled 2021-02-23: qty 2

## 2021-02-23 MED ORDER — HEPARIN BOLUS VIA INFUSION
2000.0000 [IU] | Freq: Once | INTRAVENOUS | Status: AC
  Administered 2021-02-23: 2000 [IU] via INTRAVENOUS
  Filled 2021-02-23: qty 2000

## 2021-02-23 MED ORDER — VITAMIN B-12 1000 MCG PO TABS: 500.0000 ug | ORAL_TABLET | Freq: Every day | ORAL | Status: DC

## 2021-02-23 MED ORDER — LORAZEPAM 2 MG/ML IJ SOLN: 0.5000 mg | Freq: Once | INTRAMUSCULAR | Status: DC

## 2021-02-23 MED ORDER — FENTANYL CITRATE (PF) 100 MCG/2ML IJ SOLN: 12.5000 ug | INTRAMUSCULAR | Status: DC | PRN

## 2021-02-23 MED ORDER — METOPROLOL TARTRATE 5 MG/5ML IV SOLN
5.0000 mg | INTRAVENOUS | Status: DC | PRN
Start: 2021-02-23 — End: 2021-02-26
  Administered 2021-02-23: 5 mg via INTRAVENOUS
  Filled 2021-02-23 (×2): qty 5

## 2021-02-23 MED ORDER — HALOPERIDOL LACTATE 5 MG/ML IJ SOLN: 2.0000 mg | Freq: Three times a day (TID) | INTRAMUSCULAR | Status: DC | PRN

## 2021-02-23 MED ORDER — HEPARIN BOLUS VIA INFUSION
4000.0000 [IU] | Freq: Once | INTRAVENOUS | Status: AC
  Administered 2021-02-23: 4000 [IU] via INTRAVENOUS
  Filled 2021-02-23: qty 4000

## 2021-02-23 MED ORDER — METOPROLOL TARTRATE 5 MG/5ML IV SOLN
5.0000 mg | INTRAVENOUS | Status: DC | PRN
Start: 2021-02-23 — End: 2021-02-23

## 2021-02-23 MED ORDER — ALBUTEROL SULFATE HFA 108 (90 BASE) MCG/ACT IN AERS
2.0000 | INHALATION_SPRAY | RESPIRATORY_TRACT | Status: DC | PRN
  Filled 2021-02-23: qty 6.7

## 2021-02-23 MED ORDER — HYDRALAZINE HCL 20 MG/ML IJ SOLN: 5.0000 mg | INTRAMUSCULAR | Status: DC | PRN

## 2021-02-23 MED ORDER — NITROGLYCERIN 0.4 MG SL SUBL: 0.4000 mg | SUBLINGUAL_TABLET | SUBLINGUAL | Status: DC | PRN

## 2021-02-23 MED ORDER — LORAZEPAM 2 MG/ML IJ SOLN: 2.0000 mg | Freq: Once | INTRAMUSCULAR | Status: AC

## 2021-02-23 MED ORDER — ASCORBIC ACID 500 MG PO TABS
500.0000 mg | ORAL_TABLET | Freq: Two times a day (BID) | ORAL | Status: DC
  Administered 2021-02-23: 500 mg via ORAL
  Filled 2021-02-23: qty 1

## 2021-02-23 MED ORDER — METOPROLOL TARTRATE 25 MG PO TABS
25.0000 mg | ORAL_TABLET | Freq: Once | ORAL | Status: AC
  Administered 2021-02-23: 25 mg via ORAL
  Filled 2021-02-23: qty 1

## 2021-02-23 MED ORDER — POLYVINYL ALCOHOL 1.4 % OP SOLN
1.0000 [drp] | OPHTHALMIC | Status: DC | PRN
  Filled 2021-02-23: qty 15

## 2021-02-23 MED ORDER — FAMOTIDINE 20 MG PO TABS
20.0000 mg | ORAL_TABLET | Freq: Two times a day (BID) | ORAL | Status: DC
  Administered 2021-02-23: 20 mg via ORAL
  Filled 2021-02-23: qty 1

## 2021-02-23 MED ORDER — ASPIRIN EC 81 MG PO TBEC
81.0000 mg | DELAYED_RELEASE_TABLET | Freq: Every day | ORAL | Status: DC
  Administered 2021-02-23: 81 mg via ORAL
  Filled 2021-02-23: qty 1

## 2021-02-23 MED ORDER — FLUTICASONE PROPIONATE 50 MCG/ACT NA SUSP
2.0000 | Freq: Two times a day (BID) | NASAL | Status: DC
  Administered 2021-02-25: 2 via NASAL
  Filled 2021-02-23: qty 16

## 2021-02-23 MED ORDER — HALOPERIDOL LACTATE 5 MG/ML IJ SOLN
2.0000 mg | Freq: Once | INTRAMUSCULAR | Status: AC
  Administered 2021-02-23: 2 mg via INTRAVENOUS
  Filled 2021-02-23: qty 1

## 2021-02-23 NOTE — ED Notes (Signed)
Pt placed on 2L [place] for comfort.

## 2021-02-23 NOTE — Consult Note (Signed)
ANTICOAGULATION CONSULT NOTE  Pharmacy Consult for Heparin  Indication: chest pain/ACS  Allergies  Allergen Reactions   Donepezil    Galantamine    Oxycodone     Patient Measurements: Height: '5\' 7"'$  (170.2 cm) Weight: 72.6 kg (160 lb) IBW/kg (Calculated) : 66.1 Heparin Dosing Weight: 72.6 kg  Vital Signs: Temp: 97.9 F (36.6 C) (07/31 1836) BP: 157/95 (07/31 1836) Pulse Rate: 109 (07/31 1836)  Labs: Recent Labs    02/22/2021 0742 01/28/2021 0748 02/18/2021 0928 02/15/2021 0947 02/22/2021 1719  HGB 12.2*  --   --   --   --   HCT 37.5*  --   --   --   --   PLT 206  --   --   --   --   APTT  --   --   --  29  --   LABPROT  --   --   --  17.3*  --   INR  --   --   --  1.4*  --   HEPARINUNFRC  --   --   --   --  <0.10*  CREATININE 2.11*  --   --   --   --   TROPONINIHS  --  1,589* 1,815*  --  2,780*     Estimated Creatinine Clearance: 18.3 mL/min (A) (by C-G formula based on SCr of 2.11 mg/dL (H)).   Medical History: Past Medical History:  Diagnosis Date   Arthritis    Chicken pox    Colon polyps    Elevated blood pressure reading    GERD (gastroesophageal reflux disease)    Headache(784.0)    Hypertension    Shingles    Skin cancer of arm     Medications:  Medications Prior to Admission  Medication Sig Dispense Refill Last Dose   aspirin 81 MG chewable tablet Chew 81 mg by mouth daily.   02/22/2021 at 0700   b complex vitamins capsule Take 1 capsule by mouth daily.   02/22/2021 at 0800   Cholecalciferol (VITAMIN D3) 50 MCG (2000 UT) TABS Take 2,000 Units by mouth daily.   02/22/2021 at 0700   Cyanocobalamin 500 MCG LOZG Take 1 tablet by mouth daily.   02/22/2021 at 0700   famotidine (PEPCID) 20 MG tablet Take 20 mg by mouth 2 (two) times daily.   02/22/2021 at 1900   fluticasone (FLONASE) 50 MCG/ACT nasal spray Place 2 sprays into the nose 2 (two) times daily.   02/22/2021 at 1900   Iron-Vitamin C (VITRON-C PO) Take 1 tablet by mouth daily.   02/22/2021 at 0700    levothyroxine (SYNTHROID) 75 MCG tablet Take 75 mcg by mouth daily.   02/22/2021 at 0700   mirtazapine (REMERON) 15 MG tablet Take 15 mg by mouth at bedtime.   02/22/2021 at 1900   Polyethyl Glycol-Propyl Glycol 0.4-0.3 % SOLN Apply 1 drop to eye in the morning, at noon, in the evening, and at bedtime.   02/22/2021 at 1900   sodium chloride (OCEAN) 0.65 % nasal spray Place 2 sprays into the nose 3 (three) times daily.   02/22/2021 at 1900   vitamin C (ASCORBIC ACID) 500 MG tablet Take 500 mg by mouth 2 (two) times daily.   02/22/2021 at 2100   zinc sulfate 220 (50 Zn) MG capsule Take 440 mg by mouth daily with breakfast.   02/22/2021 at 0800   ketoconazole (NIZORAL) 2 % shampoo Apply 1 application topically 2 (two) times a week.  02/21/2021 at 1000   Scheduled:   vitamin C  500 mg Oral BID   aspirin EC  81 mg Oral Daily   atorvastatin  40 mg Oral Daily   [START ON 02/24/2021] B-complex with vitamin C  1 tablet Oral Daily   [START ON 02/24/2021] cholecalciferol  2,000 Units Oral Daily   famotidine  20 mg Oral BID   [START ON 02/24/2021] ferrous sulfate  325 mg Oral Q breakfast   fluticasone  2 spray Each Nare BID   [START ON 02/24/2021] ketoconazole  1 application Topical Once per day on Mon Thu   [START ON 02/24/2021] levothyroxine  75 mcg Oral Daily   mirtazapine  15 mg Oral QHS   sodium chloride  2 spray Nasal TID   [START ON 02/24/2021] vitamin B-12  500 mcg Oral Daily   [START ON 02/24/2021] zinc sulfate  440 mg Oral Q breakfast   Infusions:   heparin 900 Units/hr (01/31/2021 1454)   PRN: albuterol, dextromethorphan-guaiFENesin, fentaNYL (SUBLIMAZE) injection, haloperidol lactate, hydrALAZINE, hydrOXYzine, metoprolol tartrate, nitroGLYCERIN, polyvinyl alcohol Anti-infectives (From admission, onward)    None       Assessment: Pharmacy consulted to start heparin infusion for ACS. No note of DOAC PTA. Trop elevated to 1589. CBC stable.   Date Time HL Rate/comment 7/31 1719 <0.10 Subtherapeutic; 900  un/hr  Goal of Therapy:  Heparin level 0.3-0.7 units/ml Monitor platelets by anticoagulation protocol: Yes   Plan:  Heparin level undetectable Per MAR, heparin gtt was paused from 1220 to 1454 Will order 2000 units bolus x 1 and increase heparin infusion to 1100 units/hr Check anti-Xa level in 8 hours and daily while on heparin Continue to monitor H&H and platelets  Darnelle Bos, PharmD 01/28/2021,8:15 PM

## 2021-02-23 NOTE — ED Notes (Signed)
Dr Niu at bedside 

## 2021-02-23 NOTE — Consult Note (Signed)
ANTICOAGULATION CONSULT NOTE  Pharmacy Consult for Heparin  Indication: chest pain/ACS  Allergies  Allergen Reactions   Donepezil    Galantamine    Oxycodone     Patient Measurements: Height: '5\' 7"'$  (170.2 cm) Weight: 72.6 kg (160 lb) IBW/kg (Calculated) : 66.1 Heparin Dosing Weight: 72.6 kg  Vital Signs: Temp: 97.2 F (36.2 C) (07/31 0743) Temp Source: Axillary (07/31 0743) BP: 107/81 (07/31 0900) Pulse Rate: 91 (07/31 0900)  Labs: Recent Labs    02/12/2021 0742 02/03/2021 0748  HGB 12.2*  --   HCT 37.5*  --   PLT 206  --   CREATININE 2.11*  --   TROPONINIHS  --  1,589*    Estimated Creatinine Clearance: 18.3 mL/min (A) (by C-G formula based on SCr of 2.11 mg/dL (H)).   Medical History: Past Medical History:  Diagnosis Date   Arthritis    Chicken pox    Colon polyps    Elevated blood pressure reading    GERD (gastroesophageal reflux disease)    Headache(784.0)    Hypertension    Shingles    Skin cancer of arm     Medications:  (Not in a hospital admission)  Scheduled:   aspirin EC  81 mg Oral Daily   atorvastatin  40 mg Oral Daily   Infusions:  PRN: fentaNYL (SUBLIMAZE) injection, hydrALAZINE, hydrOXYzine, metoprolol tartrate, nitroGLYCERIN Anti-infectives (From admission, onward)    None       Assessment: Pharmacy consulted to start heparin infusion for ACS. No note of DOAC PTA. Trop elevated to 1589. CBC stable.   Goal of Therapy:  Heparin level 0.3-0.7 units/ml Monitor platelets by anticoagulation protocol: Yes   Plan:  Give 4000 units bolus x 1 Start heparin infusion at 900 units/hr Check anti-Xa level in 8 hours and daily while on heparin Continue to monitor H&H and platelets  Oswald Hillock, PharmD, BCPS 02/10/2021,9:41 AM

## 2021-02-23 NOTE — Consult Note (Signed)
Cardiology Consultation Note    Patient ID: Matthew Monnot., MRN: [id number], DOB/AGE: 1920-03-10 87 y.o. Admit date: 01/29/2021   Date of Consult: 01/25/2021 Primary Physician: Rusty Aus, MD Primary Cardiologist:    Chief Complaint: Mechanical fall and confusion Reason for Consultation: Chest pain and confusion Requesting MD: Dr. Blaine Hamper  HPI: Matthew Coit. is a 87 y.o. male with history of history of hypertension, hypothyroidism, CKD 3, dementia and irregular heartbeat who presented to the emergency room from a SNF after a fall and noted to have altered mental status.  Patient is a very poor historian.  One of his daughters is in the room during my exam.  Patient had COVID recently and has been less functional since then.  He apparently has not eaten very much over the last several days.  His daughter notes that his confusion is somewhat more notable.  He is a resident of [facility] and has been more confused over the past several days.  He had tested positive for COVID-19 1 week ago.  As an outpatient he apparently is on aspirin 81 mg daily, famotidine daily.  In the ER laboratories were drawn.  M.  Serum troponins were 1589 and 1815.  EKG showed probable sinus tachycardia.  Telemetry at that time of my exam showed sinus rhythm with PACs.  BNP was 2706.  Patient was started on heparin for the troponins.  Hemoglobin is 12.2, creatinine was 2.11 BUN is 56.  Patient is very confused pulling his IVs out.  Past Medical History:  Diagnosis Date   Arthritis    Chicken pox    Colon polyps    Elevated blood pressure reading    GERD (gastroesophageal reflux disease)    Headache(784.0)    Hypertension    Shingles    Skin cancer of arm       Surgical History: History reviewed. No pertinent surgical history.   Home Meds: Prior to Admission medications   Medication Sig Start Date End Date Taking? Authorizing Provider  aspirin 81 MG chewable tablet Chew 81 mg by mouth daily.   Yes  [provider]  b complex vitamins capsule Take 1 capsule by mouth daily.   Yes [provider]  Cholecalciferol (VITAMIN D3) 50 MCG (2000 UT) TABS Take 2,000 Units by mouth daily.   Yes [provider]  Cyanocobalamin 500 MCG LOZG Take 1 tablet by mouth daily. 08/18/18  Yes [provider]  famotidine (PEPCID) 20 MG tablet Take 20 mg by mouth 2 (two) times daily. 11/01/20  Yes [provider]  fluticasone (FLONASE) 50 MCG/ACT nasal spray Place 2 sprays into the nose 2 (two) times daily. 08/18/18  Yes [provider]  Iron-Vitamin C (VITRON-C PO) Take 1 tablet by mouth daily.   Yes [provider]  levothyroxine (SYNTHROID) 75 MCG tablet Take 75 mcg by mouth daily. 11/01/20  Yes [provider]  mirtazapine (REMERON) 15 MG tablet Take 15 mg by mouth at bedtime. 11/01/20  Yes [provider]  Polyethyl Glycol-Propyl Glycol 0.4-0.3 % SOLN Apply 1 drop to eye in the morning, at noon, in the evening, and at bedtime.   Yes [provider]  sodium chloride (OCEAN) 0.65 % nasal spray Place 2 sprays into the nose 3 (three) times daily.   Yes [provider]  vitamin C (ASCORBIC ACID) 500 MG tablet Take 500 mg by mouth 2 (two) times daily.   Yes [provider]  zinc  sulfate 220 (50 Zn) MG capsule Take 440 mg by mouth daily with breakfast.   Yes [provider]  ketoconazole (NIZORAL) 2 % shampoo Apply 1 application topically 2 (two) times a week. 10/02/20   [provider]    Inpatient Medications:   vitamin C  500 mg Oral BID   aspirin EC  81 mg Oral Daily   atorvastatin  40 mg Oral Daily   [START ON 02/24/2021] B-complex with vitamin C  1 tablet Oral Daily   [START ON 02/24/2021] cholecalciferol  2,000 Units Oral Daily   famotidine  20 mg Oral BID   [START ON 02/24/2021] ferrous sulfate  325 mg Oral Q breakfast   fluticasone  2 spray Each Nare BID   [START ON 02/24/2021] ketoconazole  1  application Topical Once per day on Mon Thu   [START ON 02/24/2021] levothyroxine  75 mcg Oral Daily   mirtazapine  15 mg Oral QHS   sodium chloride  2 spray Nasal TID   [START ON 02/24/2021] vitamin B-12  500 mcg Oral Daily   [START ON 02/24/2021] zinc sulfate  440 mg Oral Q breakfast    heparin 900 Units/hr (01/26/2021 1053)    Allergies:  Allergies  Allergen Reactions   Donepezil    Galantamine    Oxycodone     Social History   Socioeconomic History   Marital status: Married    Spouse name: Not on file   Number of children: Not on file   Years of education: Not on file   Highest education level: Not on file  Occupational History   Not on file  Tobacco Use   Smoking status: Never   Smokeless tobacco: Not on file  Substance and Sexual Activity   Alcohol use: No   Drug use: Not on file   Sexual activity: Not on file  Other Topics Concern   Not on file  Social History Narrative   Not on file   Social Determinants of Health   Financial Resource Strain: Not on file  Food Insecurity: Not on file  Transportation Needs: Not on file  Physical Activity: Not on file  Stress: Not on file  Social Connections: Not on file  Intimate Partner Violence: Not on file     Family History  Problem Relation Age of Onset   Hypertension Mother    Heart disease Father      Review of Systems: A 12-system review of systems was performed and is negative except as noted in the HPI.  Labs: No results for input(s): CKTOTAL, CKMB, TROPONINI in the last 72 hours. Lab Results  Component Value Date   WBC 11.3 (H) 02/08/2021   HGB 12.2 (L) 01/25/2021   HCT 37.5 (L) 02/20/2021   MCV 101.1 (H) 02/12/2021   PLT 206 01/24/2021    Recent Labs  Lab 01/26/2021 0742  NA 141  K 5.0  CL 105  CO2 21*  BUN 56*  CREATININE 2.11*  CALCIUM 8.9  PROT 6.6  BILITOT 2.2*  ALKPHOS 71  ALT 106*  AST 145*  GLUCOSE 136*   No results found for: CHOL, HDL, LDLCALC, TRIG No results found for:  DDIMER  Radiology/Studies:  DG Chest 2 View  Result Date: 02/21/2021 CLINICAL DATA:  87 year old male with weakness. EXAM: CHEST - 2 VIEW COMPARISON:  None. FINDINGS: Semi upright AP and lateral views of the chest. Evidence of cardiomegaly. Other mediastinal contours are within normal limits. Visualized tracheal air column is within normal limits.  Patchy left greater than right lung base opacity, with probable small left pleural effusion. Upper lung pulmonary vascularity mildly asymmetric and increased, but no overt edema. Furthermore, there is slightly patchy asymmetric perihilar opacity including in the left mid lung. No acute osseous abnormality identified. Negative visible bowel gas pattern. IMPRESSION: Cardiomegaly with pulmonary vascular congestion, hypoventilation at both lung bases asymmetric perihilar opacity,, and possible small left pleural effusion. No overt edema, but cannot exclude multifocal respiratory infection. Electronically Signed   By: Genevie Ann M.D.   On: 02/06/2021 08:29   CT Head Wo Contrast  Result Date: 01/24/2021 CLINICAL DATA:  Mental status change. EXAM: CT HEAD WITHOUT CONTRAST TECHNIQUE: Contiguous axial images were obtained from the base of the skull through the vertex without intravenous contrast. COMPARISON:  MRI brain 04/16/2014 FINDINGS: Brain: No signs of acute brain infarct, intracranial hemorrhage or mass. Remote infarcts within the inferior cerebellum noted bilaterally. There is also a remote left occipital lobe infarct. Remote lacunar infarcts noted within the basal ganglia bilaterally. There is mild diffuse low-attenuation within the subcortical and periventricular white matter compatible with chronic microvascular disease. Prominence of the sulci and ventricles compatible with brain atrophy. Vascular: No hyperdense vessel or unexpected calcification. Skull: Normal. Negative for fracture or focal lesion. Sinuses/Orbits: Reese tension cyst noted within the left  maxillary sinus. The remaining paranasal sinuses and mastoid air cells are clear. Other: None IMPRESSION: 1. No acute intracranial abnormalities. 2. Chronic small vessel ischemic disease and brain atrophy. 3. 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 Electronically Signed   By: Kerby Moors M.D.   On: 02/12/2021 08:40   CT Cervical Spine Wo Contrast  Result Date: 02/03/2021 CLINICAL DATA:  Fall yesterday. EXAM: CT CERVICAL SPINE WITHOUT CONTRAST TECHNIQUE: Multidetector CT imaging of the cervical spine was performed without intravenous contrast. Multiplanar CT image reconstructions were also generated. COMPARISON:  None. FINDINGS: Alignment: Normal Skull base and vertebrae: The vertebral body heights are well preserved. The facet joints all appear well aligned. Soft tissues and spinal canal: No prevertebral fluid or swelling. No visible canal hematoma. Disc levels: There is partial fusion of the C3-4 disc space and solid fusion of the C4-5 disc space. Disc space narrowing and endplate spurring noted at C5-6 and C6-7. Upper chest: Right pleural effusion identified. Interlobular septal thickening and ground-glass attenuation noted in the right upper lobe. Other: None IMPRESSION: 1. No evidence for cervical spine fracture. 2. Cervical degenerative disc disease. 3. Right pleural effusion and pulmonary edema. Electronically Signed   By: Kerby Moors M.D.   On: 02/01/2021 08:43    Wt Readings from Last 3 Encounters:  02/12/2021 72.6 kg  09/23/12 85.7 kg  06/07/12 82.9 kg    EKG: Probable sinus tachycardia with no ischemia PACs  Physical Exam: Elderly Caucasian male with some confusion Blood pressure 105/87, pulse 99, temperature (!) 97.2 F (36.2 C), temperature source Axillary, resp. rate (!) 25, height '5\' 7"'$  (1.702 m), weight 72.6 kg, SpO2 98 %. Body mass index is 25.06 kg/m. General: Well developed, well nourished, in no acute distress. Head: Normocephalic, atraumatic, sclera  non-icteric, no xanthomas, nares are without discharge.  Neck: Negative for carotid bruits. JVD not elevated. Lungs:  Clear bilaterally to auscultation without wheezes, rales, or rhonchi. Breathing is unlabored. Heart: Regular rhythm with frequent ectopy. Abdomen: Soft, non-tender, non-distended with normoactive bowel sounds. No hepatomegaly. No rebound/guarding. No obvious abdominal masses. Msk:  Strength and tone appear normal for age. Extremities: No clubbing or cyanosis. No edema.  Distal pedal pulses are 2+ and equal bilaterally. Neuro: Alert but quite disoriented      Assessment and Plan  87 year old male with history of hypertension and dementia resident of a memory unit was brought to the ER with increasing confusion and disorientation as found on ground at his place of residence.  Apparently had not been eating for the last several days.  It was tested positive for COVID approximately 1 week ago.  In the emergency room was noted be tachycardic.  Empiric troponins were drawn and were at 1518 100.  It is unclear whether he had a chest pain.  There were no ischemic EKG changes.  He was placed on heparin for the troponins.  1.  Altered mental status-likely multifactorial.  Recently was diagnosed with COVID-19 positive swab.  This may be playing a role.  He is afebrile at present.  Patient has been anorexic for the last several days with evidence of acute on chronic renal insufficiency and volume depletion on his laboratories.  His EKG shows sinus tachycardia with no obvious arrhythmia.  We will continue to hydrate and follow.  2.  Elevated troponins-certainly coronary disease may be playing a role but he has not a candidate for invasive evaluation.  Had empirically been placed on heparin.  We can continue this overnight however would have a low threshold for discontinuing.  Would continue with current regimen  3.  CKD-likely due to volume depletion and anorexia.  Will  follow.  Signed, Teodoro Spray MD 02/04/2021, 2:18 PM Pager: [phone]

## 2021-02-23 NOTE — ED Triage Notes (Signed)
Pt arrives via EMS from [facility] for AMS after having a fall yesterday- pt was not seen for his fall- pt tested covid positive 1 week ago- pt states he feels "terrible" but cannot state why- pt oriented to self and place only

## 2021-02-23 NOTE — ED Notes (Signed)
Pt trying to get out of bed stating he needed to go to the bathroom and this RN attempted to help pt use the urinal- pt refused and then continued to try to get out of bed- this RN called charge nurse for backup and Anderson Malta, RN came to [place], RN came to assist while Anderson Malta, RN went to get bedside commode- pt then stated that he did not need to use bathroom and wanted to be left alone but was still continuing to try to get out of bed- Anderson Malta, RN went to get Dr Blaine Hamper to [facility] pt- see orders

## 2021-02-23 NOTE — ED Notes (Signed)
Pt taken for scans 

## 2021-02-23 NOTE — ED Notes (Signed)
Pt refusing to take pills in applesauce- daughter able to convince pt to take them

## 2021-02-23 NOTE — ED Notes (Signed)
Pt daughter given glycerin swabs and mouth moisturizer for pt dry mouth

## 2021-02-23 NOTE — H&P (Addendum)
History and Physical    Matthew Maxwell. [id number] DOB: Aug 22, 1919 DOA: 02/08/2021  Referring MD/NP/PA:   PCP: Rusty Aus, MD   Patient coming from:  The patient is coming from SNF      Chief Complaint: AMS and fall  HPI: Matthew Maxwell. is a 87 y.o. male with medical history significant of hypertension, GERD, hypothyroidism, depression with anxiety, CKD stage III, dementia, irregular heart rate, who presents with fall and AMS.  Per patient's daughter at the bedside, normally patient is alert orientated x3.  In the past 2 days, patient has been confused with agitation.  Not following command.  He fell at least 2 times in facility.  Patient had nausea and vomited once per her daughter, but no diarrhea or abdominal pain.  Patient seems to have shortness of breath and dry cough, did not complain of chest pain.  No dark stool or rectal bleeding noted.  He moves all extremities.  No facial droop or slurred speech.  Patient had positive COVID test 1 week ago in facility  ED Course: pt was found to have troponin level 1589, ALT 15, positive COVID tested today, worsening renal function, abnormal liver function with ALP 71, AST 145, ALT 506, total bilirubin 2.2), temperature 97.2, blood pressure 107/81, heart rate 100, 91, 58, RR 24, oxygen saturation 95-99% on room air.  Chest x-ray showed cardiomegaly with vascular congestion and a small left pleural effusion.  CT head is negative for acute intracranial abnormalities, showed remote infarction.  CT of C-spine is negative for acute spine injury.  Patient is admitted to progressive bed as inpatient.  Review of Systems: Could not reviewed accurately due to altered mental status.  Allergy:  Allergies  Allergen Reactions   Donepezil    Galantamine    Oxycodone     Past Medical History:  Diagnosis Date   Arthritis    Chicken pox    Colon polyps    Elevated blood pressure reading    GERD (gastroesophageal reflux disease)     Headache(784.0)    Hypertension    Shingles    Skin cancer of arm     History reviewed. No pertinent surgical history.  Could not reviewed accurately due to altered mental status.  Social History:  reports that he has never smoked. He has never used smokeless tobacco. He reports that he does not drink alcohol. No history on file for drug use.  Could not reviewed accurately due to altered mental status.  Family History:  Family History  Problem Relation Age of Onset   Hypertension Mother    Heart disease Father      Prior to Admission medications   Medication Sig Start Date End Date Taking? Authorizing Provider  ALPRAZolam (XANAX) 0.25 MG tablet Take 0.5 tablets (0.125 mg total) by mouth 2 (two) times daily as needed. 06/07/12   Crecencio Mc, MD  hydrochlorothiazide (HYDRODIURIL) 25 MG tablet Take 1 tablet (25 mg total) by mouth daily. 06/07/12   Crecencio Mc, MD  omeprazole (PRILOSEC) 20 MG capsule Take 20 mg by mouth daily.    [provider]  venlafaxine (EFFEXOR) 75 MG tablet Take 75 mg by mouth 2 (two) times daily.    [provider]    Physical Exam: Vitals:   01/27/2021 1200 02/11/2021 1426 02/19/2021 1454 02/19/2021 1530  BP:   99/69 100/73  Pulse: 66  82   Resp: (!) 28 20 (!) 26 (!) 21  Temp:  TempSrc:      SpO2: 93%  93%   Weight:      Height:       General: Not in acute distress HEENT:       Eyes: PERRL, EOMI, no scleral icterus.       ENT: No discharge from the ears and nose       Neck: No JVD, no bruit, no mass felt. Heme: No neck lymph node enlargement. Cardiac: S1/S2, RRR, No murmurs, No gallops or rubs. Respiratory: No rales, wheezing, rhonchi or rubs. GI: Soft, nondistended, nontender, no organomegaly, BS present. GU: No hematuria Ext: No pitting leg edema bilaterally. 1+DP/PT pulse bilaterally. Musculoskeletal: No joint deformities, No joint redness or warmth, no limitation of ROM in spin. Skin: No rashes.  Neuro: Patient is  confused, agitated, not following command, not oriented X3, cranial nerves II-XII grossly intact, moves all extremities normally.  Psych: Patient is not psychotic, no suicidal or hemocidal ideation.  Labs on Admission: I have personally reviewed following labs and imaging studies  CBC: Recent Labs  Lab 02/06/2021 0742  WBC 11.3*  HGB 12.2*  HCT 37.5*  MCV 101.1*  PLT [id number]   Basic Metabolic Panel: Recent Labs  Lab 01/31/2021 0742 01/30/2021 0748  NA 141  --   K 5.0  --   CL 105  --   CO2 21*  --   GLUCOSE 136*  --   BUN 56*  --   CREATININE 2.11*  --   CALCIUM 8.9  --   MG  --  2.7*   GFR: Estimated Creatinine Clearance: 18.3 mL/min (A) (by C-G formula based on SCr of 2.11 mg/dL (H)). Liver Function Tests: Recent Labs  Lab 02/22/2021 0742  AST 145*  ALT 106*  ALKPHOS 71  BILITOT 2.2*  PROT 6.6  ALBUMIN 3.5   No results for input(s): LIPASE, AMYLASE in the last 168 hours. No results for input(s): AMMONIA in the last 168 hours. Coagulation Profile: Recent Labs  Lab 02/17/2021 0947  INR 1.4*   Cardiac Enzymes: No results for input(s): CKTOTAL, CKMB, CKMBINDEX, TROPONINI in the last 168 hours. BNP (last 3 results) No results for input(s): PROBNP in the last 8760 hours. HbA1C: No results for input(s): HGBA1C in the last 72 hours. CBG: No results for input(s): GLUCAP in the last 168 hours. Lipid Profile: No results for input(s): CHOL, HDL, LDLCALC, TRIG, CHOLHDL, LDLDIRECT in the last 72 hours. Thyroid Function Tests: No results for input(s): TSH, T4TOTAL, FREET4, T3FREE, THYROIDAB in the last 72 hours. Anemia Panel: No results for input(s): VITAMINB12, FOLATE, FERRITIN, TIBC, IRON, RETICCTPCT in the last 72 hours. Urine analysis: No results found for: COLORURINE, APPEARANCEUR, LABSPEC, PHURINE, GLUCOSEU, HGBUR, BILIRUBINUR, KETONESUR, PROTEINUR, UROBILINOGEN, NITRITE, LEUKOCYTESUR Sepsis Labs: '@LABRCNTIP'$ (procalcitonin:4,lacticidven:4) ) Recent Results (from the  past 240 hour(s))  Resp Panel by RT-PCR (Flu A&B, Covid) Nasopharyngeal Swab     Status: Abnormal   Collection Time: 01/30/2021  9:07 AM   Specimen: Nasopharyngeal Swab; Nasopharyngeal(NP) swabs in vial transport medium  Result Value Ref Range Status   SARS Coronavirus 2 by RT PCR POSITIVE (A) NEGATIVE Final    Comment: RESULT CALLED TO, READ BACK BY AND VERIFIED WITH: ARIEL SMITH,RN 1024 02/12/2021 GM (NOTE) SARS-CoV-2 target nucleic acids are DETECTED.  The SARS-CoV-2 RNA is generally detectable in upper respiratory specimens during the acute phase of infection. Positive results are indicative of the presence of the identified virus, but do not rule out bacterial infection or co-infection with other pathogens not  detected by the test. Clinical correlation with patient history and other diagnostic information is necessary to determine patient infection status. The expected result is Negative.  Fact Sheet for Patients: EntrepreneurPulse.com.au  Fact Sheet for Healthcare Providers: IncredibleEmployment.be  This test is not yet approved or cleared by the [place] FDA and  has been authorized for detection and/or diagnosis of SARS-CoV-2 by FDA under an Emergency Use Authorization (EUA).  This EUA will remain in effect (meaning this test can be [place] ed) for the duration of  the COVID-19 declaration under Section 564(b)(1) of the Act, 21 U.S.C. section 360bbb-3(b)(1), unless the authorization is terminated or revoked sooner.     Influenza A by PCR NEGATIVE NEGATIVE Final   Influenza B by PCR NEGATIVE NEGATIVE Final    Comment: (NOTE) The Xpert Xpress SARS-CoV-2/FLU/RSV plus assay is intended as an aid in the diagnosis of influenza from Nasopharyngeal swab specimens and should not be used as a sole basis for treatment. Nasal washings and aspirates are unacceptable for Xpert Xpress SARS-CoV-2/FLU/RSV testing.  Fact Sheet for  Patients: EntrepreneurPulse.com.au  Fact Sheet for Healthcare Providers: IncredibleEmployment.be  This test is not yet approved or cleared by the [place] FDA and has been authorized for detection and/or diagnosis of SARS-CoV-2 by FDA under an Emergency Use Authorization (EUA). This EUA will remain in effect (meaning this test can be used) for the duration of the COVID-19 declaration under Section 564(b)(1) of the Act, 21 U.S.C. section 360bbb-3(b)(1), unless the authorization is terminated or revoked.  Performed at [facility], [address], [place], [place] [address]      Radiological Exams on Admission: DG Chest 2 View  Result Date: 02/15/2021 CLINICAL DATA:  87 year old male with weakness. EXAM: CHEST - 2 VIEW COMPARISON:  None. FINDINGS: Semi upright AP and lateral views of the chest. Evidence of cardiomegaly. Other mediastinal contours are within normal limits. Visualized tracheal air column is within normal limits. Patchy left greater than right lung base opacity, with probable small left pleural effusion. Upper lung pulmonary vascularity mildly asymmetric and increased, but no overt edema. Furthermore, there is slightly patchy asymmetric perihilar opacity including in the left mid lung. No acute osseous abnormality identified. Negative visible bowel gas pattern. IMPRESSION: Cardiomegaly with pulmonary vascular congestion, hypoventilation at both lung bases asymmetric perihilar opacity,, and possible small left pleural effusion. No overt edema, but cannot exclude multifocal respiratory infection. Electronically Signed   By: Genevie Ann M.D.   On: 02/13/2021 08:29   CT Head Wo Contrast  Result Date: 02/12/2021 CLINICAL DATA:  Mental status change. EXAM: CT HEAD WITHOUT CONTRAST TECHNIQUE: Contiguous axial images were obtained from the base of the skull through the vertex without intravenous contrast. COMPARISON:  MRI brain 04/16/2014  FINDINGS: Brain: No signs of acute brain infarct, intracranial hemorrhage or mass. Remote infarcts within the inferior cerebellum noted bilaterally. There is also a remote left occipital lobe infarct. Remote lacunar infarcts noted within the basal ganglia bilaterally. There is mild diffuse low-attenuation within the subcortical and periventricular white matter compatible with chronic microvascular disease. Prominence of the sulci and ventricles compatible with brain atrophy. Vascular: No hyperdense vessel or unexpected calcification. Skull: Normal. Negative for fracture or focal lesion. Sinuses/Orbits: Reese tension cyst noted within the left maxillary sinus. The remaining paranasal sinuses and mastoid air cells are clear. Other: None IMPRESSION: 1. No acute intracranial abnormalities. 2. Chronic small vessel ischemic disease and brain atrophy. 3. 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 Electronically Signed  By: Kerby Moors M.D.   On: 02/05/2021 08:40   CT Cervical Spine Wo Contrast  Result Date: 02/13/2021 CLINICAL DATA:  Fall yesterday. EXAM: CT CERVICAL SPINE WITHOUT CONTRAST TECHNIQUE: Multidetector CT imaging of the cervical spine was performed without intravenous contrast. Multiplanar CT image reconstructions were also generated. COMPARISON:  None. FINDINGS: Alignment: Normal Skull base and vertebrae: The vertebral body heights are well preserved. The facet joints all appear well aligned. Soft tissues and spinal canal: No prevertebral fluid or swelling. No visible canal hematoma. Disc levels: There is partial fusion of the C3-4 disc space and solid fusion of the C4-5 disc space. Disc space narrowing and endplate spurring noted at C5-6 and C6-7. Upper chest: Right pleural effusion identified. Interlobular septal thickening and ground-glass attenuation noted in the right upper lobe. Other: None IMPRESSION: 1. No evidence for cervical spine fracture. 2. Cervical degenerative disc  disease. 3. Right pleural effusion and pulmonary edema. Electronically Signed   By: Kerby Moors M.D.   On: 02/18/2021 08:43     EKG: I have personally reviewed.  Seems to be sinus rhythm with frequent PAC, QTC 514, LAD, poor R wave progression.   Assessment/Plan Principal Problem:   NSTEMI (non-ST elevated myocardial infarction) ([place]) Active Problems:   Hypertension   GERD (gastroesophageal reflux disease)   Depression with anxiety   Fall   Acute metabolic encephalopathy   Abnormal LFTs   Acute renal failure superimposed on stage 3a chronic kidney disease (H[facility])   Hypothyroidism   Leukocytosis   NSTEMI (non-ST elevated myocardial infarction) ([place]): Trop  1589 --> 1815. Dr. Ubaldo Glassing of cardi is consulted.  - admit to progressive unit as inpatient - IV heparin - Trend Trop - Repeat EKG in the am  - prn Nitroglycerin, fentanl - Aspirin, lipitor  - Risk factor stratification: will check FLP and A1C  - 2d echo  Hypertension: -IV hydralazine as needed  GERD (gastroesophageal reflux disease) -Pepcid  Depression with anxiety -Remeron  Fall -Needed PT/OT when back to facility  Acute renal failure superimposed on stage 3a chronic kidney disease ([place]): Baseline creatinine 1.2 on 08/18/2018.  His creatinine is at 2.11, BUN 56. -Check urinalysis -IV fluid: 1 LR -Avoid using renal toxic medications  Leukocytosis: WBC 11.3.  No fever.  No source of infection identified.  Chest x-ray negative for infiltration -Follow-up urinalysis  Acute metabolic encephalopathy: Etiology is not clear.  CT head is negative for acute intracranial abnormalities -Check ammonia level due to abnormal liver function -Frequent neuro check -As needed Haldol for agitation  Abnormal LFTs -Check ammonia level -Hepatitis panel -Avoid using Tylenol -[place]- RUQ  Hypothyroidism: -Synthroid   COVID-19 infection: No oxygen desaturation.  Tested negative for infiltration.  -Will not give remdesivir -As  needed albuterol and Mucinex   DVT ppx: On IV Heparin  Code Status: DNR per his daughter Family Communication:   Yes, patient's daughter at bed side Disposition Plan:  Anticipate discharge back to previous environment Consults called:  Dr. Ubaldo Glassing of card Admission status and Level of care: Progressive Cardiac:    as inpt        Status is: Inpatient  Remains inpatient appropriate because:Inpatient level of care appropriate due to severity of illness  Dispo: The patient is from: SNF              Anticipated d/c is to: SNF              Patient currently is not medically stable to d/c.  Difficult to place patient No            Date of Service 02/08/2021    [facility]   If 7PM-7AM, please contact night-coverage [email or web address] 02/03/2021, 5:51 PM

## 2021-02-23 NOTE — ED Notes (Signed)
Pills crushed and placed in applesauce after daughter states he was holding his pills under his tongue and not swallowing them

## 2021-02-23 NOTE — ED Notes (Signed)
Daughter to desk stating pt was vomiting

## 2021-02-23 NOTE — ED Provider Notes (Addendum)
[facility] Emergency Department Provider Note   ____________________________________________   Event Date/Time   First MD Initiated Contact with Patient 01/30/2021 [phone]     (approximate)  I have reviewed the triage vital signs and the nursing notes.   HISTORY  Chief Complaint Altered Mental Status    HPI Seward Wingrove. is a 87 y.o. male with past medical history of hypertension, GERD, generalized anxiety disorder, and dementia who presents to the ED for altered mental status.  History is limited due to patient's baseline dementia and current confusion, majority of history is obtained from EMS.  EMS states that patient has seemed more confused to staff at [facility] over the past 2 days.  They state that they responded to a call yesterday where patient was found down on the ground beside his bed.  His power of attorney had declined transport to the ED at that time, but when he was again found down on the ground today, they decided to have him evaluated.  Patient states that he "feels terrible", but is unable to state what exactly is bothering him.  He denies any fevers, cough, chest pain, shortness of breath, or dysuria.  Staff at [facility] was concerned that he was more confused beyond his baseline, although his baseline is unclear.  He reportedly tested positive for COVID-19 1 week ago.        Past Medical History:  Diagnosis Date   Arthritis    Chicken pox    Colon polyps    Elevated blood pressure reading    GERD (gastroesophageal reflux disease)    Headache(784.0)    Hypertension    Shingles    Skin cancer of arm     Patient Active Problem List   Diagnosis Date Noted   NSTEMI (non-ST elevated myocardial infarction) ([place]) 01/25/2021   Depression with anxiety 02/09/2021   Fall Q000111Q   Acute metabolic encephalopathy Q000111Q   Atrial fibrillation with RVR ([place]) 01/28/2021   Abnormal LFTs 01/25/2021   Acute renal failure  superimposed on stage 3a chronic kidney disease ([place]) 02/08/2021   Hypothyroidism 01/25/2021   Irregular heart rate 09/23/2012   Generalized anxiety disorder 06/10/2012   Hypertension    Arthritis    Skin cancer of arm    GERD (gastroesophageal reflux disease)     History reviewed. No pertinent surgical history.  Prior to Admission medications   Medication Sig Start Date End Date Taking? Authorizing Provider  aspirin 81 MG chewable tablet Chew 81 mg by mouth daily.   Yes [provider]  b complex vitamins capsule Take 1 capsule by mouth daily.   Yes [provider]  Cholecalciferol (VITAMIN D3) 50 MCG (2000 UT) TABS Take 2,000 Units by mouth daily.   Yes [provider]  Cyanocobalamin 500 MCG LOZG Take 1 tablet by mouth daily. 08/18/18  Yes [provider]  famotidine (PEPCID) 20 MG tablet Take 20 mg by mouth 2 (two) times daily. 11/01/20  Yes [provider]  fluticasone (FLONASE) 50 MCG/ACT nasal spray Place 2 sprays into the nose 2 (two) times daily. 08/18/18  Yes [provider]  Iron-Vitamin C (VITRON-C PO) Take 1 tablet by mouth daily.   Yes [provider]  levothyroxine (SYNTHROID) 75 MCG tablet Take 75 mcg by mouth daily. 11/01/20  Yes [provider]  mirtazapine (REMERON) 15 MG tablet Take 15 mg by mouth at bedtime. 11/01/20  Yes [provider]  Polyethyl Glycol-Propyl Glycol 0.4-0.3 %  SOLN Apply 1 drop to eye in the morning, at noon, in the evening, and at bedtime.   Yes [provider]  sodium chloride (OCEAN) 0.65 % nasal spray Place 2 sprays into the nose 3 (three) times daily.   Yes [provider]  vitamin C (ASCORBIC ACID) 500 MG tablet Take 500 mg by mouth 2 (two) times daily.   Yes [provider]  zinc sulfate 220 (50 Zn) MG capsule Take 440 mg by mouth daily with breakfast.   Yes [provider]  ketoconazole (NIZORAL) 2 % shampoo Apply 1 application  topically 2 (two) times a week. 10/02/20   [provider]    Allergies Donepezil, Galantamine, and Oxycodone  Family History  Problem Relation Age of Onset   Hypertension Mother    Heart disease Father     Social History Social History   Tobacco Use   Smoking status: Never  Substance Use Topics   Alcohol use: No    Review of Systems  Constitutional: No fever/chills.  Positive for malaise. Eyes: No visual changes. ENT: No sore throat. Cardiovascular: Denies chest pain. Respiratory: Denies shortness of breath. Gastrointestinal: No abdominal pain.  No nausea, no vomiting.  No diarrhea.  No constipation. Genitourinary: Negative for dysuria. Musculoskeletal: Negative for back pain. Skin: Negative for rash. Neurological: Negative for headaches, focal weakness or numbness.  ____________________________________________   PHYSICAL EXAM:  VITAL SIGNS: ED Triage Vitals  Enc Vitals Group     BP      Pulse      Resp      Temp      Temp src      SpO2      Weight      Height      Head Circumference      Peak Flow      Pain Score      Pain Loc      Pain Edu?      Excl. in [place]?     Constitutional: Alert and oriented to person and place, but not time or situation. Eyes: Conjunctivae are normal. Head: Atraumatic. Nose: No congestion/rhinnorhea. Mouth/Throat: Mucous membranes are dry. Neck: Normal ROM Cardiovascular: Tachycardic, irregularly irregular rhythm. Grossly normal heart sounds.  2+ radial pulses bilaterally. Respiratory: Normal respiratory effort.  No retractions. Lungs CTAB. Gastrointestinal: Soft and nontender. No distention. Genitourinary: deferred Musculoskeletal: No lower extremity tenderness nor edema. Neurologic:  Normal speech and language.  Global weakness noted with no gross focal neurologic deficits appreciated. Skin:  Skin is warm, dry and intact. No rash noted. Psychiatric: Mood and affect are normal. Speech and behavior are  normal.  ____________________________________________   LABS (all labs ordered are listed, but only abnormal results are displayed)  Labs Reviewed  COMPREHENSIVE METABOLIC PANEL - Abnormal; Notable for the following components:      Result Value   CO2 21 (*)    Glucose, Bld 136 (*)    BUN 56 (*)    Creatinine, Ser 2.11 (*)    AST 145 (*)    ALT 106 (*)    Total Bilirubin 2.2 (*)    GFR, Estimated 28 (*)    All other components within normal limits  CBC - Abnormal; Notable for the following components:   WBC 11.3 (*)    RBC 3.71 (*)    Hemoglobin 12.2 (*)    HCT 37.5 (*)    MCV 101.1 (*)    nRBC 0.9 (*)    All other components  within normal limits  MAGNESIUM - Abnormal; Notable for the following components:   Magnesium 2.7 (*)    All other components within normal limits  TROPONIN I (HIGH SENSITIVITY) - Abnormal; Notable for the following components:   Troponin I (High Sensitivity) 1,589 (*)    All other components within normal limits  RESP PANEL BY RT-PCR (FLU A&B, COVID) ARPGX2  BRAIN NATRIURETIC PEPTIDE  HEPATITIS PANEL, ACUTE  HEMOGLOBIN A1C  CBG MONITORING, ED  TROPONIN I (HIGH SENSITIVITY)   ____________________________________________  EKG  ED ECG REPORT I, Blake Divine, the attending physician, personally viewed and interpreted this ECG.   Date: 02/04/2021  EKG Time: 7:45  Rate: 141  Rhythm: atrial fibrillation  Axis: LAD  Intervals:none  ST&T Change: None   PROCEDURES  Procedure(s) performed (including Critical Care):  .Critical Care  Date/Time: 02/09/2021 9:40 AM Performed by: Blake Divine, MD Authorized by: Blake Divine, MD   Critical care provider statement:    Critical care time (minutes):  45   Critical care time was exclusive of:  Separately billable procedures and treating other patients and teaching time   Critical care was necessary to treat or prevent imminent or life-threatening deterioration of the following conditions:   Cardiac failure   Critical care was time spent personally by me on the following activities:  Discussions with consultants, evaluation of patient's response to treatment, examination of patient, ordering and performing treatments and interventions, ordering and review of laboratory studies, ordering and review of radiographic studies, pulse oximetry, re-evaluation of patient's condition, obtaining history from patient or surrogate and review of old charts   I assumed direction of critical care for this patient from another provider in my specialty: no     Care discussed with: admitting provider     ____________________________________________   INITIAL IMPRESSION / ASSESSMENT AND PLAN / ED COURSE      87 year old male with past medical history of hypertension, GERD, generalized anxiety disorder, and dementia presents to the ED after being found down on the ground beside his bed 2 days in a row, reports generalized malaise but denies any specific symptoms.  EKG shows atrial fibrillation with RVR, patient's chart does not demonstrate any history of this.  No ischemic changes noted, we will initially attempt to control heart rate with IV fluids given he appears dehydrated, also give IV dose of metoprolol.  We will further assess for traumatic injury with CT head and cervical spine, no evidence of bony extremity injury.  Will assess for infectious process with chest x-ray and UA, labs are also pending.  Chest x-ray reviewed by me and shows small pleural effusion with questionable pulmonary edema.  Patient overall appears fluid down and daughter at bedside states he has not had much to drink over the past 4 days, we will continue cautious IV fluid hydration.  Heart rate is now improved following dose of IV metoprolol and we will give follow-up oral dose.  Troponin markedly elevated, suspect related to patient's recent uncontrolled atrial fibrillation.  We will start on heparin and case discussed with  hospitalist for admission.  ----------------------------------------- 10:12 AM on 02/07/2021 ----------------------------------------- Patient now appears tachypneic with increased work of breathing, IV fluids were stopped and we will give a dose of IV Lasix.  He remains awake and alert at this time, answering questions appropriately.      ____________________________________________   FINAL CLINICAL IMPRESSION(S) / ED DIAGNOSES  Final diagnoses:  Atrial fibrillation with RVR ([place])  NSTEMI (non-ST elevated myocardial infarction) ([place])  ED Discharge Orders     None        Note:  This document was prepared using Dragon voice recognition software and may include unintentional dictation errors.    Blake Divine, MD 02/10/2021 [id number]    Blake Divine, MD 02/17/2021 [phone]

## 2021-02-24 ENCOUNTER — Inpatient Hospital Stay
Admit: 2021-02-24 | Discharge: 2021-02-24 | Disposition: A | Discharge status: Home / Self Care | Payer: Medicare Other | Attending: Internal Medicine | Admitting: Internal Medicine

## 2021-02-24 ENCOUNTER — Inpatient Hospital Stay: Payer: Medicare Other

## 2021-02-24 DIAGNOSIS — I214 Non-ST elevation (NSTEMI) myocardial infarction: Secondary | ICD-10-CM | POA: Diagnosis not present

## 2021-02-24 LAB — ECHOCARDIOGRAM COMPLETE
Height: 67 in
S' Lateral: 4.84 cm
Weight: 2560 oz

## 2021-02-24 LAB — BASIC METABOLIC PANEL
Anion gap: 16 — ABNORMAL HIGH (ref 5–15)
BUN: 75 mg/dL — ABNORMAL HIGH (ref 8–23)
CO2: 17 mmol/L — ABNORMAL LOW (ref 22–32)
Calcium: 7.8 mg/dL — ABNORMAL LOW (ref 8.9–10.3)
Chloride: 104 mmol/L (ref 98–111)
Creatinine, Ser: 2.55 mg/dL — ABNORMAL HIGH (ref 0.61–1.24)
GFR, Estimated: 22 mL/min — ABNORMAL LOW (ref >60)
Glucose, Bld: 115 mg/dL — ABNORMAL HIGH (ref 70–99)
Potassium: 3.9 mmol/L (ref 3.5–5.1)
Sodium: 137 mmol/L (ref 135–145)

## 2021-02-24 LAB — CBC
HCT: 37.8 % — ABNORMAL LOW (ref 39.0–52.0)
Hemoglobin: 12.4 g/dL — ABNORMAL LOW (ref 13.0–17.0)
MCH: 33.9 pg (ref 26.0–34.0)
MCHC: 32.8 g/dL (ref 30.0–36.0)
MCV: 103.3 fL — ABNORMAL HIGH (ref 80.0–100.0)
Platelets: 202 10*3/uL (ref 150–400)
RBC: 3.66 MIL/uL — ABNORMAL LOW (ref 4.22–5.81)
RDW: 15 % (ref 11.5–15.5)
WBC: 14.1 10*3/uL — ABNORMAL HIGH (ref 4.0–10.5)
nRBC: 0.8 % — ABNORMAL HIGH (ref 0.0–0.2)

## 2021-02-24 LAB — HEPATIC FUNCTION PANEL
ALT: 740 U/L — ABNORMAL HIGH (ref 0–44)
AST: 1094 U/L — ABNORMAL HIGH (ref 15–41)
Albumin: 3.2 g/dL — ABNORMAL LOW (ref 3.5–5.0)
Alkaline Phosphatase: 69 U/L (ref 38–126)
Bilirubin, Direct: 0.8 mg/dL — ABNORMAL HIGH (ref 0.0–0.2)
Indirect Bilirubin: 1.2 mg/dL — ABNORMAL HIGH (ref 0.3–0.9)
Total Bilirubin: 2 mg/dL — ABNORMAL HIGH (ref 0.3–1.2)
Total Protein: 6 g/dL — ABNORMAL LOW (ref 6.5–8.1)

## 2021-02-24 LAB — LIPID PANEL
Cholesterol: 132 mg/dL (ref 0–200)
HDL: 25 mg/dL — ABNORMAL LOW (ref >40)
LDL Cholesterol: 96 mg/dL (ref 0–99)
Total CHOL/HDL Ratio: 5.3 RATIO
Triglycerides: 57 mg/dL (ref <150)
VLDL: 11 mg/dL (ref 0–40)

## 2021-02-24 LAB — HEPATITIS PANEL, ACUTE
HCV Ab: NONREACTIVE
Hep A IgM: NONREACTIVE
Hep B C IgM: NONREACTIVE
Hepatitis B Surface Ag: NONREACTIVE

## 2021-02-24 LAB — TROPONIN I (HIGH SENSITIVITY): Troponin I (High Sensitivity): 2668 ng/L (ref <18)

## 2021-02-24 LAB — HEPARIN LEVEL (UNFRACTIONATED): Heparin Unfractionated: 0.34 IU/mL (ref 0.30–0.70)

## 2021-02-24 MED ORDER — LORAZEPAM 2 MG/ML IJ SOLN
1.0000 mg | INTRAMUSCULAR | Status: DC | PRN
  Administered 2021-02-24 (×2): 1 mg via INTRAVENOUS
  Filled 2021-02-24 (×2): qty 1

## 2021-02-24 MED ORDER — FAMOTIDINE 20 MG PO TABS: 10.0000 mg | ORAL_TABLET | Freq: Every day | ORAL | Status: DC

## 2021-02-24 MED ORDER — SODIUM CHLORIDE 0.9 % IV SOLN: INTRAVENOUS | Status: DC

## 2021-02-24 MED ORDER — POLYETHYL GLYCOL-PROPYL GLYCOL 0.4-0.3 % OP SOLN: 1.0000 [drp] | Freq: Every day | OPHTHALMIC | Status: DC

## 2021-02-24 MED ORDER — LORAZEPAM 2 MG/ML IJ SOLN
2.0000 mg | INTRAMUSCULAR | Status: DC | PRN
  Administered 2021-02-24 – 2021-02-25 (×4): 2 mg via INTRAVENOUS
  Filled 2021-02-24 (×4): qty 1

## 2021-02-24 MED ORDER — ENOXAPARIN SODIUM 30 MG/0.3ML IJ SOSY
30.0000 mg | PREFILLED_SYRINGE | INTRAMUSCULAR | Status: DC
  Administered 2021-02-24: 30 mg via SUBCUTANEOUS
  Filled 2021-02-24: qty 0.3

## 2021-02-24 MED ORDER — FUROSEMIDE 10 MG/ML IJ SOLN
40.0000 mg | Freq: Once | INTRAMUSCULAR | Status: AC
  Administered 2021-02-24: 40 mg via INTRAVENOUS
  Filled 2021-02-24: qty 4

## 2021-02-24 MED ORDER — POLYVINYL ALCOHOL 1.4 % OP SOLN
1.0000 [drp] | Freq: Every day | OPHTHALMIC | Status: DC
  Administered 2021-02-25: 1 [drp] via OPHTHALMIC
  Filled 2021-02-24: qty 15

## 2021-02-24 NOTE — Consult Note (Signed)
ANTICOAGULATION CONSULT NOTE  Pharmacy Consult for Heparin  Indication: chest pain/ACS  Allergies  Allergen Reactions   Donepezil    Galantamine    Oxycodone     Patient Measurements: Height: '5\' 7"'$  (170.2 cm) Weight: 72.6 kg (160 lb) IBW/kg (Calculated) : 66.1 Heparin Dosing Weight: 72.6 kg  Vital Signs: Temp: 98 F (36.7 C) (08/01 0500) Temp Source: Oral (07/31 2308) BP: 105/85 (08/01 0500) Pulse Rate: 97 (08/01 0500)  Labs: Recent Labs    02/22/2021 0742 01/24/2021 0748 02/07/2021 0928 02/16/2021 0947 02/16/2021 1719 02/24/21 0619  HGB 12.2*  --   --   --   --  12.4*  HCT 37.5*  --   --   --   --  37.8*  PLT 206  --   --   --   --  202  APTT  --   --   --  29  --   --   LABPROT  --   --   --  17.3*  --   --   INR  --   --   --  1.4*  --   --   HEPARINUNFRC  --   --   --   --  <0.10* 0.34  CREATININE 2.11*  --   --   --   --  2.55*  TROPONINIHS  --  1,589* 1,815*  --  2,780*  --      Estimated Creatinine Clearance: 15.1 mL/min (A) (by C-G formula based on SCr of 2.55 mg/dL (H)).   Medical History: Past Medical History:  Diagnosis Date   Arthritis    Chicken pox    Colon polyps    Elevated blood pressure reading    GERD (gastroesophageal reflux disease)    Headache(784.0)    Hypertension    Shingles    Skin cancer of arm     Medications:  Medications Prior to Admission  Medication Sig Dispense Refill Last Dose   aspirin 81 MG chewable tablet Chew 81 mg by mouth daily.   02/22/2021 at 0700   b complex vitamins capsule Take 1 capsule by mouth daily.   02/22/2021 at 0800   Cholecalciferol (VITAMIN D3) 50 MCG (2000 UT) TABS Take 2,000 Units by mouth daily.   02/22/2021 at 0700   Cyanocobalamin 500 MCG LOZG Take 1 tablet by mouth daily.   02/22/2021 at 0700   famotidine (PEPCID) 20 MG tablet Take 20 mg by mouth 2 (two) times daily.   02/22/2021 at 1900   fluticasone (FLONASE) 50 MCG/ACT nasal spray Place 2 sprays into the nose 2 (two) times daily.   02/22/2021 at  1900   Iron-Vitamin C (VITRON-C PO) Take 1 tablet by mouth daily.   02/22/2021 at 0700   levothyroxine (SYNTHROID) 75 MCG tablet Take 75 mcg by mouth daily.   02/22/2021 at 0700   mirtazapine (REMERON) 15 MG tablet Take 15 mg by mouth at bedtime.   02/22/2021 at 1900   Polyethyl Glycol-Propyl Glycol 0.4-0.3 % SOLN Apply 1 drop to eye in the morning, at noon, in the evening, and at bedtime.   02/22/2021 at 1900   sodium chloride (OCEAN) 0.65 % nasal spray Place 2 sprays into the nose 3 (three) times daily.   02/22/2021 at 1900   vitamin C (ASCORBIC ACID) 500 MG tablet Take 500 mg by mouth 2 (two) times daily.   02/22/2021 at 2100   zinc sulfate 220 (50 Zn) MG capsule Take 440 mg by mouth daily  with breakfast.   02/22/2021 at 0800   ketoconazole (NIZORAL) 2 % shampoo Apply 1 application topically 2 (two) times a week.   02/21/2021 at 1000   Scheduled:   vitamin C  500 mg Oral BID   aspirin EC  81 mg Oral Daily   atorvastatin  40 mg Oral Daily   B-complex with vitamin C  1 tablet Oral Daily   cholecalciferol  2,000 Units Oral Daily   famotidine  20 mg Oral BID   ferrous sulfate  325 mg Oral Q breakfast   fluticasone  2 spray Each Nare BID   ketoconazole  1 application Topical Once per day on Mon Thu   levothyroxine  75 mcg Oral Daily   mirtazapine  15 mg Oral QHS   sodium chloride  2 spray Nasal TID   vitamin B-12  500 mcg Oral Daily   zinc sulfate  440 mg Oral Q breakfast   Infusions:   heparin 1,100 Units/hr (02/24/21 0251)   PRN: albuterol, dextromethorphan-guaiFENesin, fentaNYL (SUBLIMAZE) injection, haloperidol lactate, hydrALAZINE, hydrOXYzine, metoprolol tartrate, nitroGLYCERIN, polyvinyl alcohol Anti-infectives (From admission, onward)    None       Assessment: Pharmacy consulted to start heparin infusion for ACS. No note of DOAC PTA. Trop elevated to 1589. CBC stable.   Date Time HL Rate/comment 7/31 1719 <0.10 Subtherapeutic; 900 un/hr 8/1 0619 0.34 Therapeutic x1  Goal of  Therapy:  Heparin level 0.3-0.7 units/ml Monitor platelets by anticoagulation protocol: Yes   Plan:  Will continue heparin infusion at 1100 units/hr Check anti-Xa level in 8 hours to confirm and daily while on heparin Continue to monitor H&H and platelets  Sherilyn Banker, PharmD 02/24/2021,7:55 AM

## 2021-02-24 NOTE — Progress Notes (Signed)
Per telemetry pt heart rate has been running 110-140 at times through night. MD made aware along with hypotension. PRN Lopressor order parameters are not met at this time. When entering room while patient heart rate is high, he appears to be masturbating. Patients heart rate goes back to 90-110 when lying still or just moving in bed slightly. Patient attempted to get out of bed several times through night, becoming agitated both verbally and physically. Able to keep patient in bed and calmed down at each occurrence.

## 2021-02-24 NOTE — Progress Notes (Signed)
PT Cancellation Note  Patient Details Name: Matthew Maxwell. MRN: [id number] DOB: 1920-04-27   Cancelled Treatment:    Reason Eval/Treat Not Completed: Other (comment) PT orders received, chart reviewed. Per chart pt agitated. Spoke with nurse who reports she is trying to give pt more ativan & pt not appropriate for PT at this time. Will f/u as able & as pt is appropriate.  Lavone Nian, PT, DPT 02/24/21, 12:47 PM    Waunita Schooner 02/24/2021, 12:46 PM

## 2021-02-24 NOTE — Progress Notes (Signed)
PHARMACIST - PHYSICIAN COMMUNICATION  CONCERNING:  Enoxaparin (Lovenox) for DVT Prophylaxis    RECOMMENDATION: Patient was on heparin drip for chest pain/ACS. Pharmacy has been consulted for enoxaparin for VTE prophylaxis.   Filed Weights   02/21/2021 0939  Weight: 72.6 kg (160 lb)    Body mass index is 25.06 kg/m.  Estimated Creatinine Clearance: 15.1 mL/min (A) (by C-G formula based on SCr of 2.55 mg/dL (H)).   Patient is candidate for enoxaparin '30mg'$  every 24 hours based on CrCl <68m/min  DESCRIPTION: [facility]  Patient is now receiving enoxaparin 30 mg every 24 hours    SSherilyn Banker PharmD Clinical Pharmacist  02/24/2021 11:25 AM

## 2021-02-24 NOTE — Progress Notes (Signed)
PROGRESS NOTE  Matthew Maxwell.  DOB: 1920/05/13  PCP: Rusty Aus, MD [id number]  DOA: 02/14/2021  LOS: 1 day  Hospital Day: 2   Chief Complaint  Patient presents with   Altered Mental Status    Brief narrative: Matthew Mormon. is a 87 y.o. male with PMH significant for dementia, HTN, HLD, CKD 3, irregular heartbeat. Patient was sent to the ED from her SNF after a fall and noted to have altered mental status. Per history, patient tested positive for COVID 19 in the facility 1 week ago.  He has poor appetite, worsening confusion and generalized weakness since then.   In the ED, patient was confused, pulling his lines out.  He was afebrile, heart rate 89, blood pressure 120/24, breathing on room air Labs showed BUN/creatinine elevated to 56/2.11, AST/ALT elevated to 145/106, BNP elevated 2700, troponin elevated to 1500 and subsequently to 2700. EKG read as SVT at 141 bpm, left axis deviation, LAFB, QTC prolonged to 514 ms BNP was elevated to 2700. Admitted to hospitalist service. Cardiology consultation was obtained.  Subjective: Patient was seen and examined this morning.  Elderly Caucasian male.  Lying on bed.  Needed.  Confused.  Pulling out lines, fidgeting with blankets.  Daughter at bedside feeling helpless. Overnight, heart rate in 90s, blood pressure in low normal range, oxygen saturation 90s on room air Labs this morning with creatinine further elevated to 2.55,  Assessment/Plan: Acute metabolic encephalopathy Dementia with behavioral disturbance -Delirium and dementia likely because of acute medical issues COVID-19 infection, dehydration. -CT head unremarkable. -Continue to monitor mental status change. -Continue Remeron, as needed IV Haldol  NSTEMI (non-ST elevated myocardial infarction)  -Troponin elevated and trended up to 2700. -However with no anginal symptoms.  No ischemic changes on EKG. -Patient was placed on heparin drip in the  ED. -Cardiology consultation was obtained.  Maintained on heparin drip overnight.  Patient is restless, agitated and is at risk of falling and bleeding.  Hence I decided to stop the heparin drip this morning. -Based on his advanced age and dementia, he is not a candidate for intervention -Continue to monitor. Recent Labs    01/30/2021 0928 02/02/2021 1719 02/24/21 0619  TROPONINIHS 1,815* 2,780* 2,668*   COVID-19 infection -Diagnosed 9 days prior to presentation at the nursing home -Respiratory status stable.  Worsening weakness and loss of appetite seems to be his primary problem.  Chest x-ray without infiltrates. -Supportive care -Elevated white count likely because of dehydration. Recent Labs  Lab 01/29/2021 0742 02/24/21 0619  WBC 11.3* 14.1*   AKI on CKD 3a -Admission creatinine 1.2 in January 2020.  Presented with creatinine elevated 2.1.  Further worse to 2.55 today.  Start on normal saline at 75 mill per hour. Recent Labs    02/07/2021 0742 02/24/21 0619  BUN 56* 75*  CREATININE 2.11* 2.55*   Elevated liver enzymes -Unclear cause.  Liver enzyme level further worse this morning.  Ammonia level normal.  Right upper quadrant ultrasound normal. -Obtain acute hepatitis panel. -Continue to monitor liver enzymes Recent Labs  Lab 01/27/2021 0742 02/24/21 0619  AST 145* 1,094*  ALT 106* 740*  ALKPHOS 71 69  BILITOT 2.2* 2.0*  PROT 6.6 6.0*  ALBUMIN 3.5 3.2*   Recent Labs  Lab 02/11/2021 1955  AMMONIA 19    Generalized weakness/fall -PT eval   GERD (gastroesophageal reflux disease) -Pepcid   Hypothyroidism: -Synthroid   Mobility: PT eval Code Status:   Code Status: DNR  Nutritional status: Body mass index is 25.06 kg/m.     Diet: Mental status altered at this time to be able to eat.  We will allow him to eat once mental status improved. Diet Order             Diet NPO time specified Except for: Ice Chips, Sips with Meds  Diet effective now                   DVT prophylaxis: Stop heparin drip.  Lovenox subcu prophylaxis.   Antimicrobials: None Fluid: NS at 52 Consultants: Cardiology Family Communication: Spoke with patient daughter at bedside  Status is: Inpatient  Remains inpatient appropriate because: COVID infection, elevated troponin, need of inpatient monitoring  Dispo: The patient is from: SNF              Anticipated d/c is to: Prognosis poor.  If he gets better, plan is to get him back to nursing facility              Patient currently is not medically stable to d/c.   Difficult to place patient No     Infusions:   sodium chloride      Scheduled Meds:  vitamin C  500 mg Oral BID   aspirin EC  81 mg Oral Daily   atorvastatin  40 mg Oral Daily   B-complex with vitamin C  1 tablet Oral Daily   cholecalciferol  2,000 Units Oral Daily   famotidine  20 mg Oral BID   ferrous sulfate  325 mg Oral Q breakfast   fluticasone  2 spray Each Nare BID   ketoconazole  1 application Topical Once per day on Mon Thu   levothyroxine  75 mcg Oral Daily   mirtazapine  15 mg Oral QHS   sodium chloride  2 spray Nasal TID   vitamin B-12  500 mcg Oral Daily   zinc sulfate  440 mg Oral Q breakfast    Antimicrobials: Anti-infectives (From admission, onward)    None       PRN meds: albuterol, dextromethorphan-guaiFENesin, fentaNYL (SUBLIMAZE) injection, hydrALAZINE, hydrOXYzine, LORazepam, metoprolol tartrate, nitroGLYCERIN, polyvinyl alcohol   Objective: Vitals:   02/24/21 0938 02/24/21 0946  BP: 118/90   Pulse: (!) 102 (!) 119  Resp: (!) 22   Temp: (!) 97.5 F (36.4 C)   SpO2: (!) 75% 96%    Intake/Output Summary (Last 24 hours) at 02/24/2021 1053 Last data filed at 02/24/2021 0251 Gross per 24 hour  Intake 185.28 ml  Output --  Net 185.28 ml   Filed Weights   02/08/2021 0939  Weight: 72.6 kg   Weight change:  Body mass index is 25.06 kg/m.   Physical Exam: General exam: Elderly Caucasian male.  Restless,  agitated.  Not in pain Skin: No rashes, lesions or ulcers. HEENT: Atraumatic, normocephalic, no obvious bleeding Lungs: Clear to auscultation bilaterally CVS: Regular rate and rhythm, no murmur GI/Abd soft, nontender, nondistended, bowel sound present CNS: Alert, restless, agitated, pulling things out.  Able to follow some motor commands after repeated reorientation Psychiatry: Anxious look Extremities: No pedal edema, no calf tenderness  Data Review: I have personally reviewed the laboratory data and studies available.  Recent Labs  Lab 01/29/2021 0742 02/24/21 0619  WBC 11.3* 14.1*  HGB 12.2* 12.4*  HCT 37.5* 37.8*  MCV 101.1* 103.3*  PLT 206 202   Recent Labs  Lab 02/11/2021 0742 02/18/2021 0748 02/24/21 0619  NA 141  --  137  K 5.0  --  3.9  CL 105  --  104  CO2 21*  --  17*  GLUCOSE 136*  --  115*  BUN 56*  --  75*  CREATININE 2.11*  --  2.55*  CALCIUM 8.9  --  7.8*  MG  --  2.7*  --     F/u labs ordered Unresulted Labs (From admission, onward)     Start     Ordered   02/25/21 0500  CBC  Tomorrow morning,   R        02/24/21 0756   02/24/21 1400  Heparin level (unfractionated)  Once-Timed,   TIMED        02/24/21 0756   02/24/21 1050  Hepatitis panel, acute  Add-on,   AD        02/24/21 1049   01/26/2021 1748  Urinalysis, Complete w Microscopic  Once,   R        01/31/2021 1747   02/19/2021 0945  APTT  ONCE - STAT,   STAT        02/17/2021 0945   02/07/2021 0945  Protime-INR  ONCE - STAT,   STAT        02/22/2021 0945   Unscheduled  Comprehensive metabolic panel  Daily,   R      02/24/21 1053            Signed, Terrilee Croak, MD [facility] 02/24/2021

## 2021-02-24 NOTE — Progress Notes (Signed)
Patient very confused this AM and pulling out everything.  MD stated to hold all medications, telemetry, and Heparin IV infusion; will bleed if he falls.  Resume IV infusion as soon as patient calms down.  One time Ativan 1 mg administered per MD order.  Patient has not calmed down yet and continues same behavior. Daughter by bedside.

## 2021-02-24 NOTE — Progress Notes (Signed)
Patient Name: Matthew Maxwell. Date of Encounter: 02/24/2021  Hospital Problem List     Principal Problem:   NSTEMI (non-ST elevated myocardial infarction) [facility]) Active Problems:   Hypertension   GERD (gastroesophageal reflux disease)   Depression with anxiety   Fall   Acute metabolic encephalopathy   Abnormal LFTs   Acute renal failure superimposed on stage 3a chronic kidney disease (HCC)   Hypothyroidism   Leukocytosis   COVID-19 virus infection    Patient Profile     87 y.o. male with history of history of hypertension, hypothyroidism, CKD 3, dementia and irregular heartbeat who presented to the emergency room from a SNF after a fall and noted to have altered mental status.  Patient is a very poor historian.  One of his daughters is in the room during my exam.  Patient had COVID recently and has been less functional since then.  He apparently has not eaten very much over the last several days.  His daughter notes that his confusion is somewhat more notable.  He is a resident of [facility] and has been more confused over the past several days.  He had tested positive for COVID-19 1 week ago.  As an outpatient he apparently is on aspirin 81 mg daily, famotidine daily.  In the ER laboratories were drawn.  M.  Serum troponins were 1589 and 1815.  EKG showed probable sinus tachycardia.  Telemetry at that time of my exam showed sinus rhythm with PACs.  BNP was 2706.  Patient was started on heparin for the troponins.  Hemoglobin is 12.2, creatinine was 2.11 BUN is 56.  Patient is very confused pulling his IVs out.  Subjective   Very agitate this am. Will not keep iv lines in. Hemodynamically stable with 118/90 bp, pulse 119, pulse ox 96% on ra.   Inpatient Medications     vitamin C  500 mg Oral BID   aspirin EC  81 mg Oral Daily   atorvastatin  40 mg Oral Daily   B-complex with vitamin C  1 tablet Oral Daily   cholecalciferol  2,000 Units Oral Daily   enoxaparin (LOVENOX)  injection  30 mg Subcutaneous Q24H   famotidine  20 mg Oral BID   ferrous sulfate  325 mg Oral Q breakfast   fluticasone  2 spray Each Nare BID   ketoconazole  1 application Topical Once per day on Mon Thu   levothyroxine  75 mcg Oral Daily   mirtazapine  15 mg Oral QHS   sodium chloride  2 spray Nasal TID   vitamin B-12  500 mcg Oral Daily   zinc sulfate  440 mg Oral Q breakfast    Vital Signs    Vitals:   02/24/21 0245 02/24/21 0500 02/24/21 0938 02/24/21 0946  BP:  105/85 118/90   Pulse:  97 (!) 102 (!) 119  Resp: 18 20 (!) 22   Temp:  98 F (36.7 C) (!) 97.5 F (36.4 C)   TempSrc:      SpO2:  94% (!) 75% 96%  Weight:      Height:        Intake/Output Summary (Last 24 hours) at 02/24/2021 1227 Last data filed at 02/24/2021 0251 Gross per 24 hour  Intake 185.28 ml  Output --  Net 185.28 ml   Filed Weights   01/30/2021 0939  Weight: 72.6 kg    Physical Exam    GEN: agitated elderly male HEENT: normal.  Neck: Supple,  no JVD, carotid bruits, or masses. Cardiac: tachycardic Respiratory:  Raspy coarse breath sounds. Upper airway wheezing.  GI: Soft, nontender, nondistended, BS + x 4. MS: no deformity or atrophy. Skin: warm and dry, no rash. Neuro:  Not able to give history extremely agitated. .  Labs    CBC Recent Labs    02/03/2021 0742 02/24/21 0619  WBC 11.3* 14.1*  HGB 12.2* 12.4*  HCT 37.5* 37.8*  MCV 101.1* 103.3*  PLT 206 [id number]   Basic Metabolic Panel Recent Labs    01/28/2021 0742 02/20/2021 0748 02/24/21 0619  NA 141  --  137  K 5.0  --  3.9  CL 105  --  104  CO2 21*  --  17*  GLUCOSE 136*  --  115*  BUN 56*  --  75*  CREATININE 2.11*  --  2.55*  CALCIUM 8.9  --  7.8*  MG  --  2.7*  --    Liver Function Tests Recent Labs    02/03/2021 0742 02/24/21 0619  AST 145* 1,094*  ALT 106* 740*  ALKPHOS 71 69  BILITOT 2.2* 2.0*  PROT 6.6 6.0*  ALBUMIN 3.5 3.2*   No results for input(s): LIPASE, AMYLASE in the last 72 hours. Cardiac Enzymes No  results for input(s): CKTOTAL, CKMB, CKMBINDEX, TROPONINI in the last 72 hours. BNP Recent Labs    02/15/2021 0748  BNP 2,706.0*   D-Dimer No results for input(s): DDIMER in the last 72 hours. Hemoglobin A1C Recent Labs    02/14/2021 0945  HGBA1C 5.9*   Fasting Lipid Panel Recent Labs    02/24/21 0619  CHOL 132  HDL 25*  LDLCALC 96  TRIG 57  CHOLHDL 5.3   Thyroid Function Tests No results for input(s): TSH, T4TOTAL, T3FREE, THYROIDAB in the last 72 hours.  Invalid input(s): FREET3  Telemetry    Afib vs sinus rhyhm with ectopy. Baseline artifact limits sensitivity  ECG    Sinus arrythmia vs afib. Limited sensitivity due to artifact.   Radiology    DG Chest 2 View  Result Date: 02/01/2021 CLINICAL DATA:  87 year old male with weakness. EXAM: CHEST - 2 VIEW COMPARISON:  None. FINDINGS: Semi upright AP and lateral views of the chest. Evidence of cardiomegaly. Other mediastinal contours are within normal limits. Visualized tracheal air column is within normal limits. Patchy left greater than right lung base opacity, with probable small left pleural effusion. Upper lung pulmonary vascularity mildly asymmetric and increased, but no overt edema. Furthermore, there is slightly patchy asymmetric perihilar opacity including in the left mid lung. No acute osseous abnormality identified. Negative visible bowel gas pattern. IMPRESSION: Cardiomegaly with pulmonary vascular congestion, hypoventilation at both lung bases asymmetric perihilar opacity,, and possible small left pleural effusion. No overt edema, but cannot exclude multifocal respiratory infection. Electronically Signed   By: Genevie Ann M.D.   On: 01/26/2021 08:29   CT Head Wo Contrast  Result Date: 02/15/2021 CLINICAL DATA:  Mental status change. EXAM: CT HEAD WITHOUT CONTRAST TECHNIQUE: Contiguous axial images were obtained from the base of the skull through the vertex without intravenous contrast. COMPARISON:  MRI brain  04/16/2014 FINDINGS: Brain: No signs of acute brain infarct, intracranial hemorrhage or mass. Remote infarcts within the inferior cerebellum noted bilaterally. There is also a remote left occipital lobe infarct. Remote lacunar infarcts noted within the basal ganglia bilaterally. There is mild diffuse low-attenuation within the subcortical and periventricular white matter compatible with chronic microvascular disease. Prominence of the sulci and ventricles  compatible with brain atrophy. Vascular: No hyperdense vessel or unexpected calcification. Skull: Normal. Negative for fracture or focal lesion. Sinuses/Orbits: Reese tension cyst noted within the left maxillary sinus. The remaining paranasal sinuses and mastoid air cells are clear. Other: None IMPRESSION: 1. No acute intracranial abnormalities. 2. Chronic small vessel ischemic disease and brain atrophy. 3. 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 Electronically Signed   By: Kerby Moors M.D.   On: 01/24/2021 08:40   CT Cervical Spine Wo Contrast  Result Date: 02/17/2021 CLINICAL DATA:  Fall yesterday. EXAM: CT CERVICAL SPINE WITHOUT CONTRAST TECHNIQUE: Multidetector CT imaging of the cervical spine was performed without intravenous contrast. Multiplanar CT image reconstructions were also generated. COMPARISON:  None. FINDINGS: Alignment: Normal Skull base and vertebrae: The vertebral body heights are well preserved. The facet joints all appear well aligned. Soft tissues and spinal canal: No prevertebral fluid or swelling. No visible canal hematoma. Disc levels: There is partial fusion of the C3-4 disc space and solid fusion of the C4-5 disc space. Disc space narrowing and endplate spurring noted at C5-6 and C6-7. Upper chest: Right pleural effusion identified. Interlobular septal thickening and ground-glass attenuation noted in the right upper lobe. Other: None IMPRESSION: 1. No evidence for cervical spine fracture. 2. Cervical  degenerative disc disease. 3. Right pleural effusion and pulmonary edema. Electronically Signed   By: Kerby Moors M.D.   On: 02/14/2021 08:43   US ABDOMEN LIMITED RUQ (LIVER/GB)  Result Date: 02/20/2021 CLINICAL DATA:  Elevated liver function tests EXAM: ULTRASOUND ABDOMEN LIMITED RIGHT UPPER QUADRANT COMPARISON:  None. FINDINGS: Gallbladder: The gallbladder is partially decompressed contributing to mild thickening of the gallbladder wall. No intraluminal stones or sludge is identified. There is no pericholecystic fluid. The reported sonographic Percell Miller sign is negative. Common bile duct: Diameter: 4 mm in proximal diameter Liver: Imaging of the liver is slightly limited by its intrathoracic position. No focal lesion identified. Within normal limits in parenchymal echogenicity. Portal vein is patent on color Doppler imaging with normal direction of blood flow towards the liver. Other: None. IMPRESSION: Unremarkable right upper quadrant sonogram. Electronically Signed   By: Fidela Salisbury MD   On: 01/26/2021 21:58    Assessment & Plan    86 year old male with history of hypertension and dementia resident of a memory unit was brought to the ER with increasing confusion and disorientation as found on ground at his place of residence.  Apparently had not been eating for the last several days.  It was tested positive for COVID approximately 1 week ago.  In the emergency room was noted be tachycardic.  Empiric troponins were drawn and were at 1518 100.  It is unclear whether he had a chest pain.  There were no ischemic EKG changes.  He was placed on heparin for the troponins.  1.  Altered mental status-likely multifactorial.  Recently was diagnosed with COVID-19 positive swab.  This may be playing a role.  He is afebrile at present.  Patient has been anorexic for the last several days with evidence of acute on chronic renal insufficiency and volume depletion on his laboratories. He continues to not eat or  drink since hospitalization. Not able to keep iv catheters in due to agitation.  His EKG shows sinus tachycardia with no obvious arrhythmia.  Will need to attempt to control agitation.   2.  Elevated troponins-certainly coronary disease may be playing a role but he has not a candidate for invasive evaluation.  Had empirically been  placed on heparin but will not keep ivs in. WIll d/c heparin and use lovenox sq for prophylaxis.   3.  CKD-likely due to volume depletion and anorexia.  Will follow.  Signed, Javier Docker Alira Fretwell MD 02/24/2021, 12:27 PM  Pager: [phone]

## 2021-02-24 NOTE — Progress Notes (Signed)
*  PRELIMINARY RESULTS* Echocardiogram 2D Echocardiogram has been performed.  Sherrie Sport 02/24/2021, 9:42 AM

## 2021-02-24 DEATH — deceased

## 2021-02-25 DIAGNOSIS — I214 Non-ST elevation (NSTEMI) myocardial infarction: Secondary | ICD-10-CM | POA: Diagnosis not present

## 2021-02-25 LAB — COMPREHENSIVE METABOLIC PANEL
ALT: 748 U/L — ABNORMAL HIGH (ref 0–44)
AST: 632 U/L — ABNORMAL HIGH (ref 15–41)
Albumin: 3 g/dL — ABNORMAL LOW (ref 3.5–5.0)
Alkaline Phosphatase: 85 U/L (ref 38–126)
Anion gap: 13 (ref 5–15)
BUN: 80 mg/dL — ABNORMAL HIGH (ref 8–23)
CO2: 18 mmol/L — ABNORMAL LOW (ref 22–32)
Calcium: 8 mg/dL — ABNORMAL LOW (ref 8.9–10.3)
Chloride: 111 mmol/L (ref 98–111)
Creatinine, Ser: 2.34 mg/dL — ABNORMAL HIGH (ref 0.61–1.24)
GFR, Estimated: 25 mL/min — ABNORMAL LOW (ref >60)
Glucose, Bld: 109 mg/dL — ABNORMAL HIGH (ref 70–99)
Potassium: 4.1 mmol/L (ref 3.5–5.1)
Sodium: 142 mmol/L (ref 135–145)
Total Bilirubin: 2.2 mg/dL — ABNORMAL HIGH (ref 0.3–1.2)
Total Protein: 5.8 g/dL — ABNORMAL LOW (ref 6.5–8.1)

## 2021-02-25 LAB — CBC
HCT: 37.3 % — ABNORMAL LOW (ref 39.0–52.0)
Hemoglobin: 12.6 g/dL — ABNORMAL LOW (ref 13.0–17.0)
MCH: 33.2 pg (ref 26.0–34.0)
MCHC: 33.8 g/dL (ref 30.0–36.0)
MCV: 98.2 fL (ref 80.0–100.0)
Platelets: 192 10*3/uL (ref 150–400)
RBC: 3.8 MIL/uL — ABNORMAL LOW (ref 4.22–5.81)
RDW: 15.3 % (ref 11.5–15.5)
WBC: 10.9 10*3/uL — ABNORMAL HIGH (ref 4.0–10.5)
nRBC: 1.4 % — ABNORMAL HIGH (ref 0.0–0.2)

## 2021-02-25 LAB — GLUCOSE, CAPILLARY: Glucose-Capillary: 104 mg/dL — ABNORMAL HIGH (ref 70–99)

## 2021-02-25 MED ORDER — MORPHINE SULFATE (PF) 2 MG/ML IV SOLN
0.5000 mg | Freq: Once | INTRAVENOUS | Status: AC
Start: 2021-02-25 — End: 2021-02-25
  Administered 2021-02-25: 0.5 mg via INTRAVENOUS
  Filled 2021-02-25: qty 1

## 2021-02-25 MED ORDER — METOPROLOL TARTRATE 5 MG/5ML IV SOLN
5.0000 mg | Freq: Once | INTRAVENOUS | Status: AC
  Administered 2021-02-25: 5 mg via INTRAVENOUS
  Filled 2021-02-25: qty 5

## 2021-02-25 MED ORDER — QUETIAPINE FUMARATE 25 MG PO TABS: 50.0000 mg | ORAL_TABLET | Freq: Two times a day (BID) | ORAL | Status: DC

## 2021-02-25 NOTE — Progress Notes (Addendum)
PROGRESS NOTE  Matthew Maxwell.  DOB: [birth year]  PCP: Rusty Aus, MD [id number]  DOA: 01/27/2021  LOS: 2 days  Hospital Day: 3   Chief Complaint  Patient presents with   Altered Mental Status    Brief narrative: Matthew Maxwell. is a 87 y.o. male with PMH significant for dementia, HTN, HLD, CKD 3, irregular heartbeat. Patient was sent to the ED from her SNF after a fall and noted to have altered mental status. Per history, patient tested positive for COVID 19 in the facility 1 week ago.  He has poor appetite, worsening confusion and generalized weakness since then.   In the ED, patient was confused, pulling his lines out.  He was afebrile, heart rate 89, blood pressure 120/24, breathing on room air Labs showed BUN/creatinine elevated to 56/2.11, AST/ALT elevated to 145/106, BNP elevated 2700, troponin elevated to 1500 and subsequently to 2700. EKG read as SVT at 141 bpm, left axis deviation, LAFB, QTC prolonged to 514 ms BNP was elevated to 2700. Admitted to hospitalist service. Cardiology consultation was obtained.  Subjective: Patient was seen and examined this morning.   Was sedated on IV Ativan given prior to my visit.  Per RN, patient was agitated at times last night and came down with IV Ativan.   Daughter is at bedside.    Assessment/Plan: Acute metabolic encephalopathy Dementia with behavioral disturbance -Delirium and dementia likely because of acute medical issues COVID-19 infection, dehydration. -CT head unremarkable. -Remains agitated and restless most of the times.  Came down with IV sedation.  Not awake enough to eat or follow command.   -Continue Remeron, as needed IV Ativan currently ordered for every 4 hours PRN  NSTEMI (non-ST elevated myocardial infarction)  -Troponin elevated and trended up to 2700. -However with no anginal symptoms.  No ischemic changes on EKG. -Patient was placed on heparin drip in the ED. -Cardiology consultation was  obtained.  Briefly treated with heparin drip.  Currently off.-Based on his advanced age and dementia, he is not a candidate for intervention  Acute systolic CHF -Echocardiogram 7/31 showed severely depressed EF to less than 20%.  Probably because of elevated BNP and troponin. -1 dose of IV Lasix was given by the cardiologist. -Patient at this time is completely altered, has no oral intake, already looked dehydrated.  Not an appropriate candidate for optimal CHF management. -Probably appropriate for hospice  COVID-19 infection Acute respiratory failure with hypoxia -Diagnosed 9 days prior to presentation at the nursing home -Worsening weakness and loss of appetite seems to be his primary problem.  Chest x-ray without infiltrates. -Respiratory status was initially stable but he is requiring 4 L oxygen by nasal cannula.  He has congested cough.  He is probably silently aspirating as well. -Continue supportive care -May benefit from Mucinex once he is more awake and able to cough. Recent Labs  Lab 02/12/2021 0742 02/24/21 0619 02/25/21 0719  WBC 11.3* 14.1* 10.9*   AKI on CKD 3a -Gradually improving creatinine.  Currently not on IV fluid because of worsening chest congestion.  Baseline creatinine was 1.2 in January 2020.   Recent Labs    02/13/2021 0742 02/24/21 0619 02/25/21 0719  BUN 56* 75* 80*  CREATININE 2.11* 2.55* 2.34*   Elevated liver enzymes -Liver enzymes were acutely up yesterday.  Improving today.   -Ammonia level normal.  Right upper quadrant ultrasound normal. -Negative acute hepatitis panel. -Continue to monitor liver enzymes  Generalized weakness/fall -PT eval  GERD (gastroesophageal reflux disease) -Pepcid   Hypothyroidism: -Synthroid   Mobility: PT eval once patient wakes up Code Status:   Code Status: DNR  Nutritional status: Body mass index is 25.06 kg/m.     Diet: Mental status altered at this time to be able to eat.  We will allow him to eat once  mental status improved. Diet Order             Diet NPO time specified Except for: Ice Chips, Sips with Meds  Diet effective now                  DVT prophylaxis: Stop heparin drip.  Lovenox subcu prophylaxis.enoxaparin (LOVENOX) injection 30 mg Start: 02/24/21 1215   Antimicrobials: None Fluid: None Consultants: Cardiology Family Communication: Spoke with patient's daughters at bedside.  Plan is to monitor him for next 24 hours.  If unable to maintain mental status alert, he would be appropriate for hospice.  Daughters agree with the recommendation.  Status is: Inpatient  Remains inpatient appropriate because: COVID infection, elevated troponin, need of inpatient monitoring  Dispo: The patient is from: SNF              Anticipated d/c is to: Prognosis poor.  If he gets better, plan is to get him back to nursing facility              Patient currently is not medically stable to d/c.   Difficult to place patient No     Infusions:     Scheduled Meds:  vitamin C  500 mg Oral BID   aspirin EC  81 mg Oral Daily   B-complex with vitamin C  1 tablet Oral Daily   cholecalciferol  2,000 Units Oral Daily   enoxaparin (LOVENOX) injection  30 mg Subcutaneous Q24H   famotidine  10 mg Oral Daily   ferrous sulfate  325 mg Oral Q breakfast   fluticasone  2 spray Each Nare BID   ketoconazole  1 application Topical Once per day on Mon Thu   levothyroxine  75 mcg Oral Daily   mirtazapine  15 mg Oral QHS   polyvinyl alcohol  1 drop Both Eyes QHS   QUEtiapine  50 mg Oral BID   sodium chloride  2 spray Nasal TID   vitamin B-12  500 mcg Oral Daily   zinc sulfate  440 mg Oral Q breakfast    Antimicrobials: Anti-infectives (From admission, onward)    None       PRN meds: albuterol, dextromethorphan-guaiFENesin, hydrALAZINE, hydrOXYzine, LORazepam, metoprolol tartrate, nitroGLYCERIN, polyvinyl alcohol   Objective: Vitals:   02/25/21 0328 02/25/21 0800  BP:  (!) 153/100   Pulse:  69  Resp: 18 (!) 24  Temp: 98.1 F (36.7 C) 98 F (36.7 C)  SpO2: 97% 92%   No intake or output data in the 24 hours ending 02/25/21 1538  Filed Weights   02/11/2021 0939  Weight: 72.6 kg   Weight change:  Body mass index is 25.06 kg/m.   Physical Exam: General exam: Elderly Caucasian male.  Sedated with IV Ativan Skin: No rashes, lesions or ulcers. HEENT: Atraumatic, normocephalic, no obvious bleeding Lungs: Clear to auscultation bilaterally.  Intermittent congested breath sounds mostly transmitted sounds from throat CVS: Regular rate and rhythm, no murmur GI/Abd soft, nontender, nondistended, bowel sound present CNS: Earlier he was, alert, restless, agitated, pulling things out.  Sedated at the time of my evaluation  psychiatry: Anxious look Extremities: No pedal edema,  no calf tenderness  Data Review: I have personally reviewed the laboratory data and studies available.  Recent Labs  Lab 02/12/2021 0742 02/24/21 0619 02/25/21 0719  WBC 11.3* 14.1* 10.9*  HGB 12.2* 12.4* 12.6*  HCT 37.5* 37.8* 37.3*  MCV 101.1* 103.3* 98.2  PLT 206 202 192   Recent Labs  Lab 01/26/2021 0742 02/13/2021 0748 02/24/21 0619 02/25/21 0719  NA 141  --  137 142  K 5.0  --  3.9 4.1  CL 105  --  104 111  CO2 21*  --  17* 18*  GLUCOSE 136*  --  115* 109*  BUN 56*  --  75* 80*  CREATININE 2.11*  --  2.55* 2.34*  CALCIUM 8.9  --  7.8* 8.0*  MG  --  2.7*  --   --     F/u labs ordered Unresulted Labs (From admission, onward)     Start     Ordered   02/26/21 0500  CBC with Differential/Platelet  Daily,   R     Question:  Specimen collection method  Answer:  Lab=Lab collect   02/25/21 1538   02/25/21 0500  Comprehensive metabolic panel  Daily,   R      02/24/21 1053   02/04/2021 1748  Urinalysis, Complete w Microscopic  Once,   R        02/19/2021 1747   02/02/2021 0945  APTT  ONCE - STAT,   STAT        02/15/2021 0945   01/30/2021 0945  Protime-INR  ONCE - STAT,   STAT         01/31/2021 0945            Signed, Terrilee Croak, MD [facility] 02/25/2021

## 2021-02-25 NOTE — Progress Notes (Signed)
Patient's HR is irregular ranging from 110-150's RR is 32-35. Respiratory informed and will change to high flow. Dr. Hal Hope informed.

## 2021-02-25 NOTE — Progress Notes (Signed)
Pt not following commands enough to take PO meds/MD made aware. Confused x4, restless. Mitts in place/Tele sitter in place. Bed low and locked, bed alarm activated. Will continue to monitor.

## 2021-02-25 NOTE — Progress Notes (Signed)
Pt MEWS yellow.  Pt agitated and fighting staff.  Was given ativan per MAR.  Pt was noticeably cold to touch, but had also thrown off all of his blankets.  Warm blankets placed on patient and room temp turned up.  Pt calmed significantly after ativan and rectal temp was noted to be 98.61F.  Dr. Elmer Picker made aware as well as charge RN.  Pt was also placed on O2 at this time.      02/25/21 0300  Assess: MEWS Score  Temp (!) 96.6 F (35.9 C)  BP (!) 133/115  Pulse Rate 75  Resp (!) 24  Level of Consciousness Alert  SpO2 94 %  O2 Device Nasal Cannula  O2 Flow Rate (L/min) 4 L/min  Assess: MEWS Score  MEWS Temp 1  MEWS Systolic 0  MEWS Pulse 0  MEWS RR 1  MEWS LOC 0  MEWS Score 2  MEWS Score Color Yellow  Assess: if the MEWS score is Yellow or Red  Were vital signs taken at a resting state? No  Focused Assessment No change from prior assessment  Does the patient meet 2 or more of the SIRS criteria? No  MEWS guidelines implemented *See Row Information* No, vital signs rechecked  Assess: SIRS CRITERIA  SIRS Temperature  1  SIRS Pulse 0  SIRS Respirations  1  SIRS WBC 0  SIRS Score Sum  2

## 2021-02-25 NOTE — Progress Notes (Signed)
PT Cancellation Note  Patient Details Name: Matthew Maxwell. MRN: [id number] DOB: 1920/01/06   Cancelled Treatment:    Reason Eval/Treat Not Completed: Other (comment). PT order received, pt chart reviewed. PT communicated with MD regarding elevated troponins - pt cleared for PT. PT spoke to RN - RN states pt is restless, A&Ox0 and not following commands. Will continue to follow and evaluate when clinically appropriate.   Patrina Levering PT, DPT 02/25/21 12:39 PM [id number]  [facility] Lab 02/25/2021, 12:35 PM

## 2021-02-26 ENCOUNTER — Inpatient Hospital Stay: Payer: Medicare Other

## 2021-02-26 DIAGNOSIS — G9341 Metabolic encephalopathy: Secondary | ICD-10-CM | POA: Diagnosis not present

## 2021-02-26 DIAGNOSIS — N179 Acute kidney failure, unspecified: Secondary | ICD-10-CM | POA: Diagnosis not present

## 2021-02-26 DIAGNOSIS — R945 Abnormal results of liver function studies: Secondary | ICD-10-CM | POA: Diagnosis not present

## 2021-02-26 DIAGNOSIS — I214 Non-ST elevation (NSTEMI) myocardial infarction: Secondary | ICD-10-CM | POA: Diagnosis not present

## 2021-02-26 LAB — COMPREHENSIVE METABOLIC PANEL
ALT: 728 U/L — ABNORMAL HIGH (ref 0–44)
AST: 428 U/L — ABNORMAL HIGH (ref 15–41)
Albumin: 3.2 g/dL — ABNORMAL LOW (ref 3.5–5.0)
Alkaline Phosphatase: 100 U/L (ref 38–126)
Anion gap: 12 (ref 5–15)
BUN: 81 mg/dL — ABNORMAL HIGH (ref 8–23)
CO2: 22 mmol/L (ref 22–32)
Calcium: 8.1 mg/dL — ABNORMAL LOW (ref 8.9–10.3)
Chloride: 114 mmol/L — ABNORMAL HIGH (ref 98–111)
Creatinine, Ser: 2.24 mg/dL — ABNORMAL HIGH (ref 0.61–1.24)
GFR, Estimated: 26 mL/min — ABNORMAL LOW (ref >60)
Glucose, Bld: 122 mg/dL — ABNORMAL HIGH (ref 70–99)
Potassium: 3.9 mmol/L (ref 3.5–5.1)
Sodium: 148 mmol/L — ABNORMAL HIGH (ref 135–145)
Total Bilirubin: 2.3 mg/dL — ABNORMAL HIGH (ref 0.3–1.2)
Total Protein: 6 g/dL — ABNORMAL LOW (ref 6.5–8.1)

## 2021-02-26 LAB — CBC WITH DIFFERENTIAL/PLATELET
Abs Immature Granulocytes: 0.13 10*3/uL — ABNORMAL HIGH (ref 0.00–0.07)
Basophils Absolute: 0 10*3/uL (ref 0.0–0.1)
Basophils Relative: 0 %
Eosinophils Absolute: 0 10*3/uL (ref 0.0–0.5)
Eosinophils Relative: 0 %
HCT: 40.5 % (ref 39.0–52.0)
Hemoglobin: 13.1 g/dL (ref 13.0–17.0)
Immature Granulocytes: 1 %
Lymphocytes Relative: 6 %
Lymphs Abs: 0.6 10*3/uL — ABNORMAL LOW (ref 0.7–4.0)
MCH: 32.9 pg (ref 26.0–34.0)
MCHC: 32.3 g/dL (ref 30.0–36.0)
MCV: 101.8 fL — ABNORMAL HIGH (ref 80.0–100.0)
Monocytes Absolute: 1.2 10*3/uL — ABNORMAL HIGH (ref 0.1–1.0)
Monocytes Relative: 11 %
Neutro Abs: 9 10*3/uL — ABNORMAL HIGH (ref 1.7–7.7)
Neutrophils Relative %: 82 %
Platelets: 162 10*3/uL (ref 150–400)
RBC: 3.98 MIL/uL — ABNORMAL LOW (ref 4.22–5.81)
RDW: 15.7 % — ABNORMAL HIGH (ref 11.5–15.5)
WBC: 11 10*3/uL — ABNORMAL HIGH (ref 4.0–10.5)
nRBC: 2.3 % — ABNORMAL HIGH (ref 0.0–0.2)

## 2021-02-26 MED ORDER — LORAZEPAM 1 MG PO TABS: 1.0000 mg | ORAL_TABLET | ORAL | Status: DC | PRN

## 2021-02-26 MED ORDER — SODIUM CHLORIDE 0.9% FLUSH: 3.0000 mL | INTRAVENOUS | Status: DC | PRN

## 2021-02-26 MED ORDER — HYDROMORPHONE BOLUS VIA INFUSION: 0.5000 mg | INTRAVENOUS | Status: DC | PRN

## 2021-02-26 MED ORDER — GLYCOPYRROLATE 0.2 MG/ML IJ SOLN
0.2000 mg | INTRAMUSCULAR | Status: DC | PRN
  Administered 2021-02-27: 0.2 mg via INTRAVENOUS
  Filled 2021-02-26 (×2): qty 1

## 2021-02-26 MED ORDER — HYDROMORPHONE BOLUS VIA INFUSION
0.5000 mg | INTRAVENOUS | Status: DC | PRN
  Administered 2021-02-26: 1 mg via INTRAVENOUS
  Administered 2021-02-27: 0.5 mg via INTRAVENOUS
  Filled 2021-02-26: qty 1

## 2021-02-26 MED ORDER — GLYCOPYRROLATE 0.2 MG/ML IJ SOLN
0.2000 mg | INTRAMUSCULAR | Status: DC | PRN
  Filled 2021-02-26 (×2): qty 1

## 2021-02-26 MED ORDER — LORAZEPAM 2 MG/ML IJ SOLN: 1.0000 mg | INTRAMUSCULAR | Status: DC | PRN

## 2021-02-26 MED ORDER — HALOPERIDOL 0.5 MG PO TABS
0.5000 mg | ORAL_TABLET | ORAL | Status: DC | PRN
  Filled 2021-02-26: qty 1

## 2021-02-26 MED ORDER — DIPHENHYDRAMINE HCL 50 MG/ML IJ SOLN: 12.5000 mg | INTRAMUSCULAR | Status: DC | PRN

## 2021-02-26 MED ORDER — SODIUM CHLORIDE 0.9 % IV SOLN
0.5000 mg/h | INTRAVENOUS | Status: DC
  Administered 2021-02-26: 1 mg/h via INTRAVENOUS
  Filled 2021-02-26 (×2): qty 5

## 2021-02-26 MED ORDER — ACETAMINOPHEN 650 MG RE SUPP: 650.0000 mg | Freq: Four times a day (QID) | RECTAL | Status: DC | PRN

## 2021-02-26 MED ORDER — HALOPERIDOL LACTATE 5 MG/ML IJ SOLN: 0.5000 mg | INTRAMUSCULAR | Status: DC | PRN

## 2021-02-26 MED ORDER — HALOPERIDOL LACTATE 2 MG/ML PO CONC
0.5000 mg | ORAL | Status: DC | PRN
  Filled 2021-02-26: qty 0.3

## 2021-02-26 MED ORDER — LORAZEPAM 2 MG/ML PO CONC: 1.0000 mg | ORAL | Status: DC | PRN

## 2021-02-26 MED ORDER — ACETAMINOPHEN 325 MG PO TABS: 650.0000 mg | ORAL_TABLET | Freq: Four times a day (QID) | ORAL | Status: DC | PRN

## 2021-02-26 MED ORDER — SODIUM CHLORIDE 0.9 % IV SOLN: 250.0000 mL | INTRAVENOUS | Status: DC | PRN

## 2021-02-26 MED ORDER — ONDANSETRON HCL 4 MG/2ML IJ SOLN: 4.0000 mg | Freq: Four times a day (QID) | INTRAMUSCULAR | Status: DC | PRN

## 2021-02-26 MED ORDER — GLYCOPYRROLATE 1 MG PO TABS
1.0000 mg | ORAL_TABLET | ORAL | Status: DC | PRN
  Filled 2021-02-26: qty 1

## 2021-02-26 MED ORDER — ONDANSETRON 4 MG PO TBDP
4.0000 mg | ORAL_TABLET | Freq: Four times a day (QID) | ORAL | Status: DC | PRN
  Filled 2021-02-26: qty 1

## 2021-02-26 MED ORDER — SODIUM CHLORIDE 0.9% FLUSH
3.0000 mL | Freq: Two times a day (BID) | INTRAVENOUS | Status: DC
  Administered 2021-02-26 – 2021-02-27 (×3): 3 mL via INTRAVENOUS

## 2021-02-26 NOTE — Progress Notes (Signed)
Patient Name: Matthew Maxwell. Date of Encounter: 02/26/2021  Hospital Problem List     Principal Problem:   NSTEMI (non-ST elevated myocardial infarction) [facility]) Active Problems:   Hypertension   GERD (gastroesophageal reflux disease)   Depression with anxiety   Fall   Acute metabolic encephalopathy   Abnormal LFTs   Acute renal failure superimposed on stage 3a chronic kidney disease (HCC)   Hypothyroidism   Leukocytosis   COVID-19 virus infection    Patient Profile        87 y.o. male with history of history of hypertension, hypothyroidism, CKD 3, dementia and irregular heartbeat who presented to the emergency room from a SNF after a fall and noted to have altered mental status.  Patient is a very poor historian.  One of his daughters is in the room during my exam.  Patient had COVID recently and has been less functional since then.  He apparently has not eaten very much over the last several days.  His daughter notes that his confusion is somewhat more notable.  He is a resident of [facility] and has been more confused over the past several days.  He had tested positive for COVID-19 1 week ago.  As an outpatient he apparently is on aspirin 81 mg daily, famotidine daily.  In the ER laboratories were drawn.   Serum troponins were 1589 and 1815.  EKG showed probable sinus tachycardia.  Telemetry at that time of my exam showed sinus rhythm with PACs.  BNP was 2706.  Patient was started on heparin for the troponins.  Hemoglobin is 12.2, creatinine was 2.11 BUN is 56.  Patient is very confused pulling his IVs out.  Subjective   Confused and unable to give a history  Inpatient Medications     QUEtiapine  50 mg Oral BID   sodium chloride flush  3 mL Intravenous Q12H    Vital Signs    Vitals:   02/25/21 2008 02/25/21 2148 02/26/21 0300 02/26/21 0754  BP: (!) 135/101  120/70 (!) 145/120  Pulse: 75  100 (!) 115  Resp:    (!) 22  Temp: 98.5 F (36.9 C)  97.8 F (36.6 C) 98.6  F (37 C)  TempSrc:    Rectal  SpO2: 90% 95% 96% 100%  Weight:      Height:       No intake or output data in the 24 hours ending 02/26/21 1305 Filed Weights   02/22/2021 0939  Weight: 72.6 kg    Physical Exam    GEN: Chronically ill-appearing male, high frailty score HEENT: normal.  Neck: Supple, no JVD, carotid bruits, or masses. Cardiac: Irregular regular Respiratory:  Respirations somewhat irregular.Marland Kitchen GI: Soft, nontender, nondistended, BS + x 4. MS: no deformity or atrophy. Skin: warm and dry, no rash. Neuro: Confused.  Not able to give history.  Labs    CBC Recent Labs    02/25/21 0719 02/26/21 0345  WBC 10.9* 11.0*  NEUTROABS  --  9.0*  HGB 12.6* 13.1  HCT 37.3* 40.5  MCV 98.2 101.8*  PLT 192 0000000   Basic Metabolic Panel Recent Labs    02/25/21 0719 02/26/21 0345  NA 142 148*  K 4.1 3.9  CL 111 114*  CO2 18* 22  GLUCOSE 109* 122*  BUN 80* 81*  CREATININE 2.34* 2.24*  CALCIUM 8.0* 8.1*   Liver Function Tests Recent Labs    02/25/21 0719 02/26/21 0345  AST 632* 428*  ALT 748* 728*  ALKPHOS 85 100  BILITOT 2.2* 2.3*  PROT 5.8* 6.0*  ALBUMIN 3.0* 3.2*   No results for input(s): LIPASE, AMYLASE in the last 72 hours. Cardiac Enzymes No results for input(s): CKTOTAL, CKMB, CKMBINDEX, TROPONINI in the last 72 hours. BNP No results for input(s): BNP in the last 72 hours. D-Dimer No results for input(s): DDIMER in the last 72 hours. Hemoglobin A1C No results for input(s): HGBA1C in the last 72 hours. Fasting Lipid Panel Recent Labs    02/24/21 0619  CHOL 132  HDL 25*  LDLCALC 96  TRIG 57  CHOLHDL 5.3   Thyroid Function Tests No results for input(s): TSH, T4TOTAL, T3FREE, THYROIDAB in the last 72 hours.  Invalid input(s): FREET3  Telemetry    Probable sinus tachycardia  ECG    Sinus tachycardia  Radiology    DG Chest 2 View  Result Date: 02/06/2021 CLINICAL DATA:  87 year old male with weakness. EXAM: CHEST - 2 VIEW  COMPARISON:  None. FINDINGS: Semi upright AP and lateral views of the chest. Evidence of cardiomegaly. Other mediastinal contours are within normal limits. Visualized tracheal air column is within normal limits. Patchy left greater than right lung base opacity, with probable small left pleural effusion. Upper lung pulmonary vascularity mildly asymmetric and increased, but no overt edema. Furthermore, there is slightly patchy asymmetric perihilar opacity including in the left mid lung. No acute osseous abnormality identified. Negative visible bowel gas pattern. IMPRESSION: Cardiomegaly with pulmonary vascular congestion, hypoventilation at both lung bases asymmetric perihilar opacity,, and possible small left pleural effusion. No overt edema, but cannot exclude multifocal respiratory infection. Electronically Signed   By: Genevie Ann M.D.   On: 02/01/2021 08:29   CT Head Wo Contrast  Result Date: 02/20/2021 CLINICAL DATA:  Mental status change. EXAM: CT HEAD WITHOUT CONTRAST TECHNIQUE: Contiguous axial images were obtained from the base of the skull through the vertex without intravenous contrast. COMPARISON:  MRI brain 04/16/2014 FINDINGS: Brain: No signs of acute brain infarct, intracranial hemorrhage or mass. Remote infarcts within the inferior cerebellum noted bilaterally. There is also a remote left occipital lobe infarct. Remote lacunar infarcts noted within the basal ganglia bilaterally. There is mild diffuse low-attenuation within the subcortical and periventricular white matter compatible with chronic microvascular disease. Prominence of the sulci and ventricles compatible with brain atrophy. Vascular: No hyperdense vessel or unexpected calcification. Skull: Normal. Negative for fracture or focal lesion. Sinuses/Orbits: Reese tension cyst noted within the left maxillary sinus. The remaining paranasal sinuses and mastoid air cells are clear. Other: None IMPRESSION: 1. No acute intracranial abnormalities. 2.  Chronic small vessel ischemic disease and brain atrophy. 3. 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 Electronically Signed   By: Kerby Moors M.D.   On: 02/14/2021 08:40   CT Cervical Spine Wo Contrast  Result Date: 01/28/2021 CLINICAL DATA:  Fall yesterday. EXAM: CT CERVICAL SPINE WITHOUT CONTRAST TECHNIQUE: Multidetector CT imaging of the cervical spine was performed without intravenous contrast. Multiplanar CT image reconstructions were also generated. COMPARISON:  None. FINDINGS: Alignment: Normal Skull base and vertebrae: The vertebral body heights are well preserved. The facet joints all appear well aligned. Soft tissues and spinal canal: No prevertebral fluid or swelling. No visible canal hematoma. Disc levels: There is partial fusion of the C3-4 disc space and solid fusion of the C4-5 disc space. Disc space narrowing and endplate spurring noted at C5-6 and C6-7. Upper chest: Right pleural effusion identified. Interlobular septal thickening and ground-glass attenuation noted in the right  upper lobe. Other: None IMPRESSION: 1. No evidence for cervical spine fracture. 2. Cervical degenerative disc disease. 3. Right pleural effusion and pulmonary edema. Electronically Signed   By: Kerby Moors M.D.   On: 02/02/2021 08:43   DG Chest Port 1 View  Result Date: 02/26/2021 CLINICAL DATA:  COVID.  Shortness of breath. EXAM: PORTABLE CHEST 1 VIEW COMPARISON:  02/24/2021. FINDINGS: Cardiomegaly. Bilateral pulmonary infiltrates/edema and bilateral pleural effusions. Interim progression from prior exams. No pneumothorax. Degenerative change thoracic spine. IMPRESSION: 1.  Cardiomegaly. 2. Bilateral pulmonary infiltrates/edema and bilateral pleural effusions. Interim progression from prior exam. Electronically Signed   By: Marcello Moores  Register   On: 02/26/2021 10:19   DG Chest Port 1 View  Result Date: 02/24/2021 CLINICAL DATA:  Shortness of breath, COVID-19 positive EXAM: PORTABLE CHEST 1  VIEW COMPARISON:  02/20/2021 FINDINGS: Patient is rotated. Stable heart size. Atherosclerotic calcification of the aortic knob. Mild pulmonary vascular congestion. Streaky bibasilar opacities, slightly increased from prior. Possible small bilateral pleural effusions. No pneumothorax. IMPRESSION: 1. Mild pulmonary vascular congestion with streaky bibasilar opacities, slightly increased from prior. Findings could reflect worsening edema or infection. 2. Possible small bilateral pleural effusions. Electronically Signed   By: Davina Poke D.O.   On: 02/24/2021 14:06   ECHOCARDIOGRAM COMPLETE  Result Date: 02/24/2021    ECHOCARDIOGRAM REPORT   Patient Name:   Matthew Maxwell. Date of Exam: 02/24/2021 Medical Rec #:  [id number]            Height:       67.0 in Accession #:    [id number]           Weight:       160.0 lb Date of Birth:  07-14-1920             BSA:          1.839 m Patient Age:    84 years             BP:           118/90 mmHg Patient Gender: M                    HR:           102 bpm. Exam Location:  [facility] Procedure: 2D Echo, Color Doppler and Cardiac Doppler Indications:     NSTEMI I21.4  History:         Patient has no prior history of Echocardiogram examinations.                  Risk Factors:Hypertension.  Sonographer:     Sherrie Sport RDCS (AE) Referring Phys:  [place] Diagnosing Phys: Neoma Laming MD  Sonographer Comments: Technically challenging study due to limited acoustic windows, no apical window and no subcostal window. Image acquisition challenging due to uncooperative patient and Pt was moving arms and legs---pushing scan hand away from chest. IMPRESSIONS  1. Left ventricular ejection fraction, by estimation, is <20%. The left ventricle has severely decreased function. The left ventricle demonstrates global hypokinesis. The left ventricular internal cavity size was severely dilated. Left ventricular diastolic function could not be evaluated.  2. Right ventricular systolic function  is moderately reduced. The right ventricular size is moderately enlarged. Mildly increased right ventricular wall thickness.  3. Left atrial size was severely dilated.  4. Right atrial size was severely dilated.  5. The mitral valve is abnormal. Mild to moderate mitral valve regurgitation.  6. The tricuspid valve  is abnormal. Tricuspid valve regurgitation is mild to moderate.  7. The aortic valve is calcified. Aortic valve regurgitation is mild. Mild aortic valve sclerosis is present, with no evidence of aortic valve stenosis.  8. The pulmonic valve was abnormal. FINDINGS  Left Ventricle: Left ventricular ejection fraction, by estimation, is <20%. The left ventricle has severely decreased function. The left ventricle demonstrates global hypokinesis. The left ventricular internal cavity size was severely dilated. There is borderline left ventricular hypertrophy. Left ventricular diastolic function could not be evaluated. Right Ventricle: The right ventricular size is moderately enlarged. Mildly increased right ventricular wall thickness. Right ventricular systolic function is moderately reduced. Left Atrium: Left atrial size was severely dilated. Right Atrium: Right atrial size was severely dilated. Pericardium: Trivial pericardial effusion is present. Mitral Valve: The mitral valve is abnormal. Mild to moderate mitral valve regurgitation. Tricuspid Valve: The tricuspid valve is abnormal. Tricuspid valve regurgitation is mild to moderate. Aortic Valve: The aortic valve is calcified. Aortic valve regurgitation is mild. Mild aortic valve sclerosis is present, with no evidence of aortic valve stenosis. Pulmonic Valve: The pulmonic valve was abnormal. Pulmonic valve regurgitation is mild. Aorta: The aortic root, ascending aorta and aortic arch are all structurally normal, with no evidence of dilitation or obstruction. IAS/Shunts: The interatrial septum was not assessed.  LEFT VENTRICLE PLAX 2D LVIDd:         5.17 cm  LVIDs:         4.84 cm LV PW:         1.25 cm LV IVS:        1.30 cm LVOT diam:     2.00 cm LVOT Area:     3.14 cm  LEFT ATRIUM         Index LA diam:    4.30 cm 2.34 cm/m   AORTA Ao Root diam: 2.80 cm TRICUSPID VALVE TR Peak grad:   48.7 mmHg TR Vmax:        349.00 cm/s  SHUNTS Systemic Diam: 2.00 cm Neoma Laming MD Electronically signed by Neoma Laming MD Signature Date/Time: 02/24/2021/3:26:05 PM    Final    US ABDOMEN LIMITED RUQ (LIVER/GB)  Result Date: 02/03/2021 CLINICAL DATA:  Elevated liver function tests EXAM: ULTRASOUND ABDOMEN LIMITED RIGHT UPPER QUADRANT COMPARISON:  None. FINDINGS: Gallbladder: The gallbladder is partially decompressed contributing to mild thickening of the gallbladder wall. No intraluminal stones or sludge is identified. There is no pericholecystic fluid. The reported sonographic Percell Miller sign is negative. Common bile duct: Diameter: 4 mm in proximal diameter Liver: Imaging of the liver is slightly limited by its intrathoracic position. No focal lesion identified. Within normal limits in parenchymal echogenicity. Portal vein is patent on color Doppler imaging with normal direction of blood flow towards the liver. Other: None. IMPRESSION: Unremarkable right upper quadrant sonogram. Electronically Signed   By: Fidela Salisbury MD   On: 02/01/2021 21:58    Assessment & Plan     87 year old male with history of hypertension and dementia resident of a memory unit was brought to the ER with increasing confusion and disorientation as found on ground at his place of residence.  Apparently had not been eating for the last several days.  It was tested positive for COVID approximately 1 week ago.  In the emergency room was noted be tachycardic.  Empiric troponins were drawn and were at 1518 100.  It is unclear whether he had a chest pain.  There were no ischemic EKG changes.    1.  Altered mental status-likely multifactorial.  Recently was diagnosed with COVID-19 positive swab.  This may  be playing a role.  He is afebrile at present.  Patient has been anorexic for the last several days with evidence of acute on chronic renal insufficiency and volume depletion on his laboratories. He continues to not eat or drink since hospitalization. Not able to keep iv catheters in due to agitation.  His EKG shows sinus tachycardia with no obvious arrhythmia.  Will need to attempt to control agitation.   2.  Elevated troponins-certainly coronary disease may be playing a role but he has not a candidate for invasive evaluation.  Had empirically been placed on heparin but will not keep ivs in. WIll use lovenox sq for prophylaxis.   3.  CKD-likely due to volume depletion and anorexia.  4.  Probable pneumonitis secondary to aspiration.  5.  COVID-19 infection-resultant hypoxia.  Requiring high flow nasal cannula oxygen.  Very poor prognosis agree with comfort measures.    Signed, Javier Docker Emmert Roethler MD 02/26/2021, 1:05 PM  Pager: [phone]

## 2021-02-26 NOTE — Progress Notes (Signed)
Worsening-"gurgling"-clearly aspirating-on 15L of HFNC Very restless Spoke w 2 daughters at bedside-long discussion-explained poor prognoses-patient is clearly suffering-they are agreeable to transition to full comfort measures. Discussed with RN-comfort orders placed.

## 2021-02-26 NOTE — Progress Notes (Addendum)
PROGRESS NOTE  Matthew Maxwell.  DOB: Mar 07, 1920  PCP: Rusty Aus, MD [id number]  DOA: 02/05/2021  LOS: 3 days  Hospital Day: 4   Chief Complaint  Patient presents with   Altered Mental Status    Brief narrative: Matthew Ebron. is a 87 y.o. male with PMH significant for dementia, HTN, HLD, CKD 3a- presented with acute metabolic encephalopathy, non-STEMI, new onset acute systolic heart failure-found to have COVID-19 infection-subsequently developed worsening hypoxemia (requiring up to 15 L of HFNC) likely due to aspiration pneumonitis-after extensive discussion with family at bedside on 8/3-transition to full comfort measures.   Subjective: Confused-attempting to take off mittens.  Accumulating secretions-gurgling  Assessment/Plan: Acute metabolic encephalopathy superimposed on dementia: Acute encephalopathy due to COVID-19 infection/hypoxemia-remains persistently encephalopathic-after discussion with family-transitioning to full comfort measures today.  NSTEMI (non-ST elevated myocardial infarction): Appreciate cardiology input-not a candidate for intervention.  As noted above-transitioning to full comfort measures.  Acute systolic CHF ([id number] on [id number]): Likely ischemic cardiomyopathy-not a candidate for any further therapy/intervention-transitioning to comfort measures on 8/3.  Acute hypoxic respiratory failure due to COVID-19/aspiration pneumonia: Worsening-now on 15 L-appears to be accumulating secretions-and overtly aspirating.  After extensive discussion with 2 daughters at bedside-transition to full comfort measures today.  AKI on CKD stage IIIa: AKI likely hemodynamically mediated.  Not a candidate for RRT.  Transitioning to comfort measures.  Transaminitis: Likely due to COVID-19 infection-no plans to follow LFTs-transitioning to full comfort measures.  GERD: Stop Pepcid-on comfort measures  Hypothyroidism: Stop Synthroid-on comfort measures  Palliative  care: DNR in place-we will rapidly worsening-now on 15 L of oxygen this morning-overtly aspirating and accumulating secretions.  After extensive discussion with 2 daughters at bedside-goal of care is for comfort-starting Dilaudid infusion.  Expect inpatient death.   Code Status:   Code Status: DNR  Nutritional status: Body mass index is 25.06 kg/m.    Diet:  Diet Order             Diet NPO time specified Except for: Ice Chips, Sips with Meds  Diet effective now                  DVT prophylaxis: Not needed-as comfort measures in effect.   Antimicrobials: None Fluid: None Consultants: Cardiology Family Communication: 2 daughters at bedside-see above.  Status is: Inpatient  Remains inpatient appropriate because: COVID infection, elevated troponin, need of inpatient monitoring  Dispo: The patient is from: SNF              Anticipated d/c is to: Expect inpatient death.              Patient currently is not medically stable to d/c.   Difficult to place patient No   Infusions:   sodium chloride     HYDROmorphone 1 mg/hr (02/26/21 1030)     Scheduled Meds:  QUEtiapine  50 mg Oral BID   sodium chloride flush  3 mL Intravenous Q12H    Antimicrobials: Anti-infectives (From admission, onward)    None       PRN meds: sodium chloride, acetaminophen **OR** acetaminophen, diphenhydrAMINE, glycopyrrolate **OR** glycopyrrolate **OR** glycopyrrolate, haloperidol **OR** haloperidol **OR** haloperidol lactate, HYDROmorphone, LORazepam **OR** LORazepam **OR** LORazepam, LORazepam, ondansetron **OR** ondansetron (ZOFRAN) IV, sodium chloride flush   Objective: Vitals:   02/26/21 0300 02/26/21 0754  BP: 120/70 (!) 145/120  Pulse: 100 (!) 115  Resp:  (!) 22  Temp: 97.8 F (36.6 C) 98.6 F (37 C)  SpO2: 96% 100%  No intake or output data in the 24 hours ending 02/26/21 1242  Filed Weights   01/29/2021 0939  Weight: 72.6 kg   Weight change:  Body mass index is 25.06  kg/m.   Physical Exam: Gen Exam: Confused-gurgling-aspirating. HEENT:atraumatic, normocephalic Chest: Transmitted upper airway sounds. CVS:S1S2 regular Abdomen:soft non tender, non distended Extremities:no edema Neurology: Difficult exam-but seems to move all 4 extremities. Skin: no rash   Data Review: I have personally reviewed the laboratory data and studies available.  Recent Labs  Lab 02/18/2021 0742 02/24/21 0619 02/25/21 0719 02/26/21 0345  WBC 11.3* 14.1* 10.9* 11.0*  NEUTROABS  --   --   --  9.0*  HGB 12.2* 12.4* 12.6* 13.1  HCT 37.5* 37.8* 37.3* 40.5  MCV 101.1* 103.3* 98.2 101.8*  PLT 206 202 192 162    Recent Labs  Lab 02/04/2021 0742 02/02/2021 0748 02/24/21 0619 02/25/21 0719 02/26/21 0345  NA 141  --  137 142 148*  K 5.0  --  3.9 4.1 3.9  CL 105  --  104 111 114*  CO2 21*  --  17* 18* 22  GLUCOSE 136*  --  115* 109* 122*  BUN 56*  --  75* 80* 81*  CREATININE 2.11*  --  2.55* 2.34* 2.24*  CALCIUM 8.9  --  7.8* 8.0* 8.1*  MG  --  2.7*  --   --   --      F/u labs ordered Unresulted Labs (From admission, onward)     Start     Ordered   01/24/2021 0945  APTT  ONCE - STAT,   STAT        01/30/2021 0945   02/01/2021 0945  Protime-INR  ONCE - STAT,   STAT        02/01/2021 0945            Signed, Oren Binet, MD [facility] 02/26/2021

## 2021-02-26 NOTE — Progress Notes (Signed)
PT Cancellation Note  Patient Details Name: Matthew Maxwell. MRN: [id number] DOB: 07/09/1920   Cancelled Treatment:    Reason Eval/Treat Not Completed: Other (comment). Order received, pt chart reviewed. Pt status has declined and he has transitioned to comfort measures. PT signing off.   Patrina Levering PT, DPT 02/26/21 10:01 AM [id number]  Ramonita Lab 02/26/2021, 10:00 AM

## 2021-02-26 NOTE — Care Management Important Message (Signed)
Important Message  Patient Details  Name: Matthew Maxwell. MRN: [id number] Date of Birth: 1920-06-20   Medicare Important Message Given:  Other (see comment)  On comfort care measures.  Medicare IM withheld at this time.    Dannette Barbara 02/26/2021, 10:39 AM

## 2021-03-27 NOTE — Death Summary Note (Signed)
DEATH SUMMARY   Patient Details  Name: Matthew Maxwell. MRN: [id number] DOB: 04/20/1920  Admission/Discharge Information   Admit Date:  02/26/21  Date of Death: Date of Death: 2021/03/02  Time of Death: Time of Death: 10-15-21  Length of Stay: 4  Referring Physician: Rusty Aus, MD   Reason(s) for Hospitalization   Acute metabolic encephalopathy Non-STEMI Acute systolic heart failure Acute hypoxic respiratory failure COVID-19 pneumonia Aspiration pneumonia AKI on CKD stage IIIa  Diagnoses  Preliminary cause of death:  Secondary Diagnoses (including complications and co-morbidities):  Principal Problem:   NSTEMI (non-ST elevated myocardial infarction) ([place]) Active Problems:   Hypertension   GERD (gastroesophageal reflux disease)   Depression with anxiety   Fall   Acute metabolic encephalopathy   Abnormal LFTs   Acute renal failure superimposed on stage 3a chronic kidney disease (HCC)   Hypothyroidism   Leukocytosis   COVID-19 virus infection   Brief Hospital Course (including significant findings, care, treatment, and services provided and events leading to death)   Brief narrative: Matthew Maxwell. is a 87 y.o. male with PMH significant for dementia, HTN, HLD, CKD 3a- presented with acute metabolic encephalopathy, non-STEMI, new onset acute systolic heart failure-found to have COVID-19 infection-subsequently developed worsening hypoxemia (requiring up to 15 L of HFNC) likely due to aspiration pneumonitis-after extensive discussion with family at bedside on 8/3-transitioned to full comfort measures.  Patient subsequently expired on 2023-03-03.  Hospital course by problem list Acute metabolic encephalopathy superimposed on dementia: Acute encephalopathy due to COVID-19 infection/hypoxemia-remained persistently encephalopathic-after discussion with family on 8/3-he was transitioned to full comfort measures.   NSTEMI (non-ST elevated myocardial infarction): Evaluated by  cardiology-not a candidate for intervention.     Acute systolic CHF ([id number]): Likely ischemic cardiomyopathy-not a candidate for any further therapy/intervention   Acute hypoxic respiratory failure due to COVID-19/aspiration pneumonia: Hospital course complicated by worsening hypoxemia requiring upto 15 L of HFNC.  Suspect this was mostly from aspiration pneumonitis (overtly aspirating on exam) and some component of COVID-19 pneumonia.  Given severity of hypoxemia-ongoing overt aspiration of his secretions-after extensive discussion with daughters on 8/3-he was transitioned to full comfort measures.  Patient was subsequently started on Dilaudid infusion-and passed away on March 03, 2023.  This MD had a long talk with the patient's daughters on 03-03-2023 morning-explained impending death-as he was having apneic spells.   AKI on CKD stage IIIa: AKI likely hemodynamically mediated.  Not a candidate for RRT.    Transaminitis: Likely due to COVID-19 infectio   GERD: Stopped Pepcid-on comfort measures   Hypothyroidism: Stopped Synthroid-on comfort measures  Pertinent Labs and Studies  Significant Diagnostic Studies DG Chest 2 View  Result Date: 02-26-21 CLINICAL DATA:  87 year old male with weakness. EXAM: CHEST - 2 VIEW COMPARISON:  None. FINDINGS: Semi upright AP and lateral views of the chest. Evidence of cardiomegaly. Other mediastinal contours are within normal limits. Visualized tracheal air column is within normal limits. Patchy left greater than right lung base opacity, with probable small left pleural effusion. Upper lung pulmonary vascularity mildly asymmetric and increased, but no overt edema. Furthermore, there is slightly patchy asymmetric perihilar opacity including in the left mid lung. No acute osseous abnormality identified. Negative visible bowel gas pattern. IMPRESSION: Cardiomegaly with pulmonary vascular congestion, hypoventilation at both lung bases asymmetric perihilar opacity,, and  possible small left pleural effusion. No overt edema, but cannot exclude multifocal respiratory infection. Electronically Signed   By: Genevie Ann M.D.   On: 26-Feb-2021  08:29   CT Head Wo Contrast  Result Date: 01/30/2021 CLINICAL DATA:  Mental status change. EXAM: CT HEAD WITHOUT CONTRAST TECHNIQUE: Contiguous axial images were obtained from the base of the skull through the vertex without intravenous contrast. COMPARISON:  MRI brain 04/16/2014 FINDINGS: Brain: No signs of acute brain infarct, intracranial hemorrhage or mass. Remote infarcts within the inferior cerebellum noted bilaterally. There is also a remote left occipital lobe infarct. Remote lacunar infarcts noted within the basal ganglia bilaterally. There is mild diffuse low-attenuation within the subcortical and periventricular white matter compatible with chronic microvascular disease. Prominence of the sulci and ventricles compatible with brain atrophy. Vascular: No hyperdense vessel or unexpected calcification. Skull: Normal. Negative for fracture or focal lesion. Sinuses/Orbits: Reese tension cyst noted within the left maxillary sinus. The remaining paranasal sinuses and mastoid air cells are clear. Other: None IMPRESSION: 1. No acute intracranial abnormalities. 2. Chronic small vessel ischemic disease and brain atrophy. 3. 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 Electronically Signed   By: Kerby Moors M.D.   On: 02/02/2021 08:40   CT Cervical Spine Wo Contrast  Result Date: 01/27/2021 CLINICAL DATA:  Fall yesterday. EXAM: CT CERVICAL SPINE WITHOUT CONTRAST TECHNIQUE: Multidetector CT imaging of the cervical spine was performed without intravenous contrast. Multiplanar CT image reconstructions were also generated. COMPARISON:  None. FINDINGS: Alignment: Normal Skull base and vertebrae: The vertebral body heights are well preserved. The facet joints all appear well aligned. Soft tissues and spinal canal: No prevertebral  fluid or swelling. No visible canal hematoma. Disc levels: There is partial fusion of the C3-4 disc space and solid fusion of the C4-5 disc space. Disc space narrowing and endplate spurring noted at C5-6 and C6-7. Upper chest: Right pleural effusion identified. Interlobular septal thickening and ground-glass attenuation noted in the right upper lobe. Other: None IMPRESSION: 1. No evidence for cervical spine fracture. 2. Cervical degenerative disc disease. 3. Right pleural effusion and pulmonary edema. Electronically Signed   By: Kerby Moors M.D.   On: 02/07/2021 08:43   DG Chest Port 1 View  Result Date: 02/26/2021 CLINICAL DATA:  COVID.  Shortness of breath. EXAM: PORTABLE CHEST 1 VIEW COMPARISON:  02/24/2021. FINDINGS: Cardiomegaly. Bilateral pulmonary infiltrates/edema and bilateral pleural effusions. Interim progression from prior exams. No pneumothorax. Degenerative change thoracic spine. IMPRESSION: 1.  Cardiomegaly. 2. Bilateral pulmonary infiltrates/edema and bilateral pleural effusions. Interim progression from prior exam. Electronically Signed   By: Marcello Moores  Register   On: 02/26/2021 10:19   DG Chest Port 1 View  Result Date: 02/24/2021 CLINICAL DATA:  Shortness of breath, COVID-19 positive EXAM: PORTABLE CHEST 1 VIEW COMPARISON:  02/17/2021 FINDINGS: Patient is rotated. Stable heart size. Atherosclerotic calcification of the aortic knob. Mild pulmonary vascular congestion. Streaky bibasilar opacities, slightly increased from prior. Possible small bilateral pleural effusions. No pneumothorax. IMPRESSION: 1. Mild pulmonary vascular congestion with streaky bibasilar opacities, slightly increased from prior. Findings could reflect worsening edema or infection. 2. Possible small bilateral pleural effusions. Electronically Signed   By: Davina Poke D.O.   On: 02/24/2021 14:06   ECHOCARDIOGRAM COMPLETE  Result Date: 02/24/2021    ECHOCARDIOGRAM REPORT   Patient Name:   Janell Cocchi. Date of  Exam: 02/24/2021 Medical Rec #:  [id number]            Height:       67.0 in Accession #:    [id number]           Weight:  160.0 lb Date of Birth:  08-21-1919             BSA:          1.839 m Patient Age:    44 years             BP:           118/90 mmHg Patient Gender: M                    HR:           102 bpm. Exam Location:  [facility] Procedure: 2D Echo, Color Doppler and Cardiac Doppler Indications:     NSTEMI I21.4  History:         Patient has no prior history of Echocardiogram examinations.                  Risk Factors:Hypertension.  Sonographer:     Sherrie Sport RDCS (AE) Referring Phys:  [place] Diagnosing Phys: Neoma Laming MD  Sonographer Comments: Technically challenging study due to limited acoustic windows, no apical window and no subcostal window. Image acquisition challenging due to uncooperative patient and Pt was moving arms and legs---pushing scan hand away from chest. IMPRESSIONS  1. Left ventricular ejection fraction, by estimation, is <20%. The left ventricle has severely decreased function. The left ventricle demonstrates global hypokinesis. The left ventricular internal cavity size was severely dilated. Left ventricular diastolic function could not be evaluated.  2. Right ventricular systolic function is moderately reduced. The right ventricular size is moderately enlarged. Mildly increased right ventricular wall thickness.  3. Left atrial size was severely dilated.  4. Right atrial size was severely dilated.  5. The mitral valve is abnormal. Mild to moderate mitral valve regurgitation.  6. The tricuspid valve is abnormal. Tricuspid valve regurgitation is mild to moderate.  7. The aortic valve is calcified. Aortic valve regurgitation is mild. Mild aortic valve sclerosis is present, with no evidence of aortic valve stenosis.  8. The pulmonic valve was abnormal. FINDINGS  Left Ventricle: Left ventricular ejection fraction, by estimation, is <20%. The left ventricle has severely decreased  function. The left ventricle demonstrates global hypokinesis. The left ventricular internal cavity size was severely dilated. There is borderline left ventricular hypertrophy. Left ventricular diastolic function could not be evaluated. Right Ventricle: The right ventricular size is moderately enlarged. Mildly increased right ventricular wall thickness. Right ventricular systolic function is moderately reduced. Left Atrium: Left atrial size was severely dilated. Right Atrium: Right atrial size was severely dilated. Pericardium: Trivial pericardial effusion is present. Mitral Valve: The mitral valve is abnormal. Mild to moderate mitral valve regurgitation. Tricuspid Valve: The tricuspid valve is abnormal. Tricuspid valve regurgitation is mild to moderate. Aortic Valve: The aortic valve is calcified. Aortic valve regurgitation is mild. Mild aortic valve sclerosis is present, with no evidence of aortic valve stenosis. Pulmonic Valve: The pulmonic valve was abnormal. Pulmonic valve regurgitation is mild. Aorta: The aortic root, ascending aorta and aortic arch are all structurally normal, with no evidence of dilitation or obstruction. IAS/Shunts: The interatrial septum was not assessed.  LEFT VENTRICLE PLAX 2D LVIDd:         5.17 cm LVIDs:         4.84 cm LV PW:         1.25 cm LV IVS:        1.30 cm LVOT diam:     2.00 cm LVOT Area:     3.14 cm  LEFT ATRIUM         Index LA diam:    4.30 cm 2.34 cm/m   AORTA Ao Root diam: 2.80 cm TRICUSPID VALVE TR Peak grad:   48.7 mmHg TR Vmax:        349.00 cm/s  SHUNTS Systemic Diam: 2.00 cm Neoma Laming MD Electronically signed by Neoma Laming MD Signature Date/Time: 02/24/2021/3:26:05 PM    Final    US ABDOMEN LIMITED RUQ (LIVER/GB)  Result Date: 02/22/2021 CLINICAL DATA:  Elevated liver function tests EXAM: ULTRASOUND ABDOMEN LIMITED RIGHT UPPER QUADRANT COMPARISON:  None. FINDINGS: Gallbladder: The gallbladder is partially decompressed contributing to mild thickening of the  gallbladder wall. No intraluminal stones or sludge is identified. There is no pericholecystic fluid. The reported sonographic Percell Miller sign is negative. Common bile duct: Diameter: 4 mm in proximal diameter Liver: Imaging of the liver is slightly limited by its intrathoracic position. No focal lesion identified. Within normal limits in parenchymal echogenicity. Portal vein is patent on color Doppler imaging with normal direction of blood flow towards the liver. Other: None. IMPRESSION: Unremarkable right upper quadrant sonogram. Electronically Signed   By: Fidela Salisbury MD   On: 02/10/2021 21:58    Microbiology Recent Results (from the past 240 hour(s))  Resp Panel by RT-PCR (Flu A&B, Covid) Nasopharyngeal Swab     Status: Abnormal   Collection Time: 02/09/2021  9:07 AM   Specimen: Nasopharyngeal Swab; Nasopharyngeal(NP) swabs in vial transport medium  Result Value Ref Range Status   SARS Coronavirus 2 by RT PCR POSITIVE (A) NEGATIVE Final    Comment: RESULT CALLED TO, READ BACK BY AND VERIFIED WITH: ARIEL SMITH,RN 1024 01/30/2021 GM (NOTE) SARS-CoV-2 target nucleic acids are DETECTED.  The SARS-CoV-2 RNA is generally detectable in upper respiratory specimens during the acute phase of infection. Positive results are indicative of the presence of the identified virus, but do not rule out bacterial infection or co-infection with other pathogens not detected by the test. Clinical correlation with patient history and other diagnostic information is necessary to determine patient infection status. The expected result is Negative.  Fact Sheet for Patients: EntrepreneurPulse.com.au  Fact Sheet for Healthcare Providers: IncredibleEmployment.be  This test is not yet approved or cleared by the [place] FDA and  has been authorized for detection and/or diagnosis of SARS-CoV-2 by FDA under an Emergency Use Authorization (EUA).  This EUA will remain in effect  (meaning this test can be [place] ed) for the duration of  the COVID-19 declaration under Section 564(b)(1) of the Act, 21 U.S.C. section 360bbb-3(b)(1), unless the authorization is terminated or revoked sooner.     Influenza A by PCR NEGATIVE NEGATIVE Final   Influenza B by PCR NEGATIVE NEGATIVE Final    Comment: (NOTE) The Xpert Xpress SARS-CoV-2/FLU/RSV plus assay is intended as an aid in the diagnosis of influenza from Nasopharyngeal swab specimens and should not be used as a sole basis for treatment. Nasal washings and aspirates are unacceptable for Xpert Xpress SARS-CoV-2/FLU/RSV testing.  Fact Sheet for Patients: EntrepreneurPulse.com.au  Fact Sheet for Healthcare Providers: IncredibleEmployment.be  This test is not yet approved or cleared by the [place] FDA and has been authorized for detection and/or diagnosis of SARS-CoV-2 by FDA under an Emergency Use Authorization (EUA). This EUA will remain in effect (meaning this test can be used) for the duration of the COVID-19 declaration under Section 564(b)(1) of the Act, 21 U.S.C. section 360bbb-3(b)(1), unless the authorization is terminated or revoked.  Performed at [facility], [place], [place] [address]     Lab Basic Metabolic Panel: Recent Labs  Lab 02/15/2021 [phone] 02/11/2021 0748 02/24/21 0619 02/25/21 0719 02/26/21 0345  NA 141  --  137 142 148*  K 5.0  --  3.9 4.1 3.9  CL 105  --  104 111 114*  CO2 21*  --  17* 18* 22  GLUCOSE 136*  --  115* 109* 122*  BUN 56*  --  75* 80* 81*  CREATININE 2.11*  --  2.55* 2.34* 2.24*  CALCIUM 8.9  --  7.8* 8.0* 8.1*  MG  --  2.7*  --   --   --    Liver Function Tests: Recent Labs  Lab 02/07/2021 0742 02/24/21 0619 02/25/21 0719 02/26/21 0345  AST 145* 1,094* 632* 428*  ALT 106* 740* 748* 728*  ALKPHOS 71 69 85 100  BILITOT 2.2* 2.0* 2.2* 2.3*  PROT 6.6 6.0* 5.8* 6.0*  ALBUMIN 3.5 3.2* 3.0* 3.2*   No  results for input(s): LIPASE, AMYLASE in the last 168 hours. Recent Labs  Lab 01/28/2021 1955  AMMONIA 19   CBC: Recent Labs  Lab 02/11/2021 0742 02/24/21 0619 02/25/21 0719 02/26/21 0345  WBC 11.3* 14.1* 10.9* 11.0*  NEUTROABS  --   --   --  9.0*  HGB 12.2* 12.4* 12.6* 13.1  HCT 37.5* 37.8* 37.3* 40.5  MCV 101.1* 103.3* 98.2 101.8*  PLT 206 202 192 162   Cardiac Enzymes: No results for input(s): CKTOTAL, CKMB, CKMBINDEX, TROPONINI in the last 168 hours. Sepsis Labs: Recent Labs  Lab 02/03/2021 0742 02/24/21 0619 02/25/21 0719 02/26/21 0345  WBC 11.3* 14.1* 10.9* 11.0*    Procedures/Operations     Ajanae Virag March 14, 2021, 3:28 PM

## 2021-03-27 NOTE — Progress Notes (Signed)
   03/21/21 1205  Clinical Encounter Type  Visited With Family  Visit Type Death  Referral From Nurse  Consult/Referral To Chaplain  Spiritual Encounters  Spiritual Needs Emotional;Grief support;Prayer  Chaplain Burris responded to referral from [place], RN to attend family for grief support. Chaplain Burris provided a compassionate presence and reflective listening. Pt's daughters shared experiences that led to hospitalization. Family shared appreciation for compassionate care and frank conversation with hospitalist. Daryel November assisted family in naming and processing feelings around long term care experiences and also circumstances that may have led to Covid exposure. Chaplain Burris offered prayer and also facilitated discussion about memorial planning, story telling and meaning-making, as well as self-care.

## 2021-03-27 DEATH — deceased

## 2022-09-07 IMAGING — DX DG CHEST 1V PORT
1 series · 1 of 1 positions shown · non-contrast
Comparison: 02/24/2021.

CLINICAL DATA: COVID.  Shortness of breath.

EXAM:
PORTABLE CHEST 1 VIEW

[chest ap]
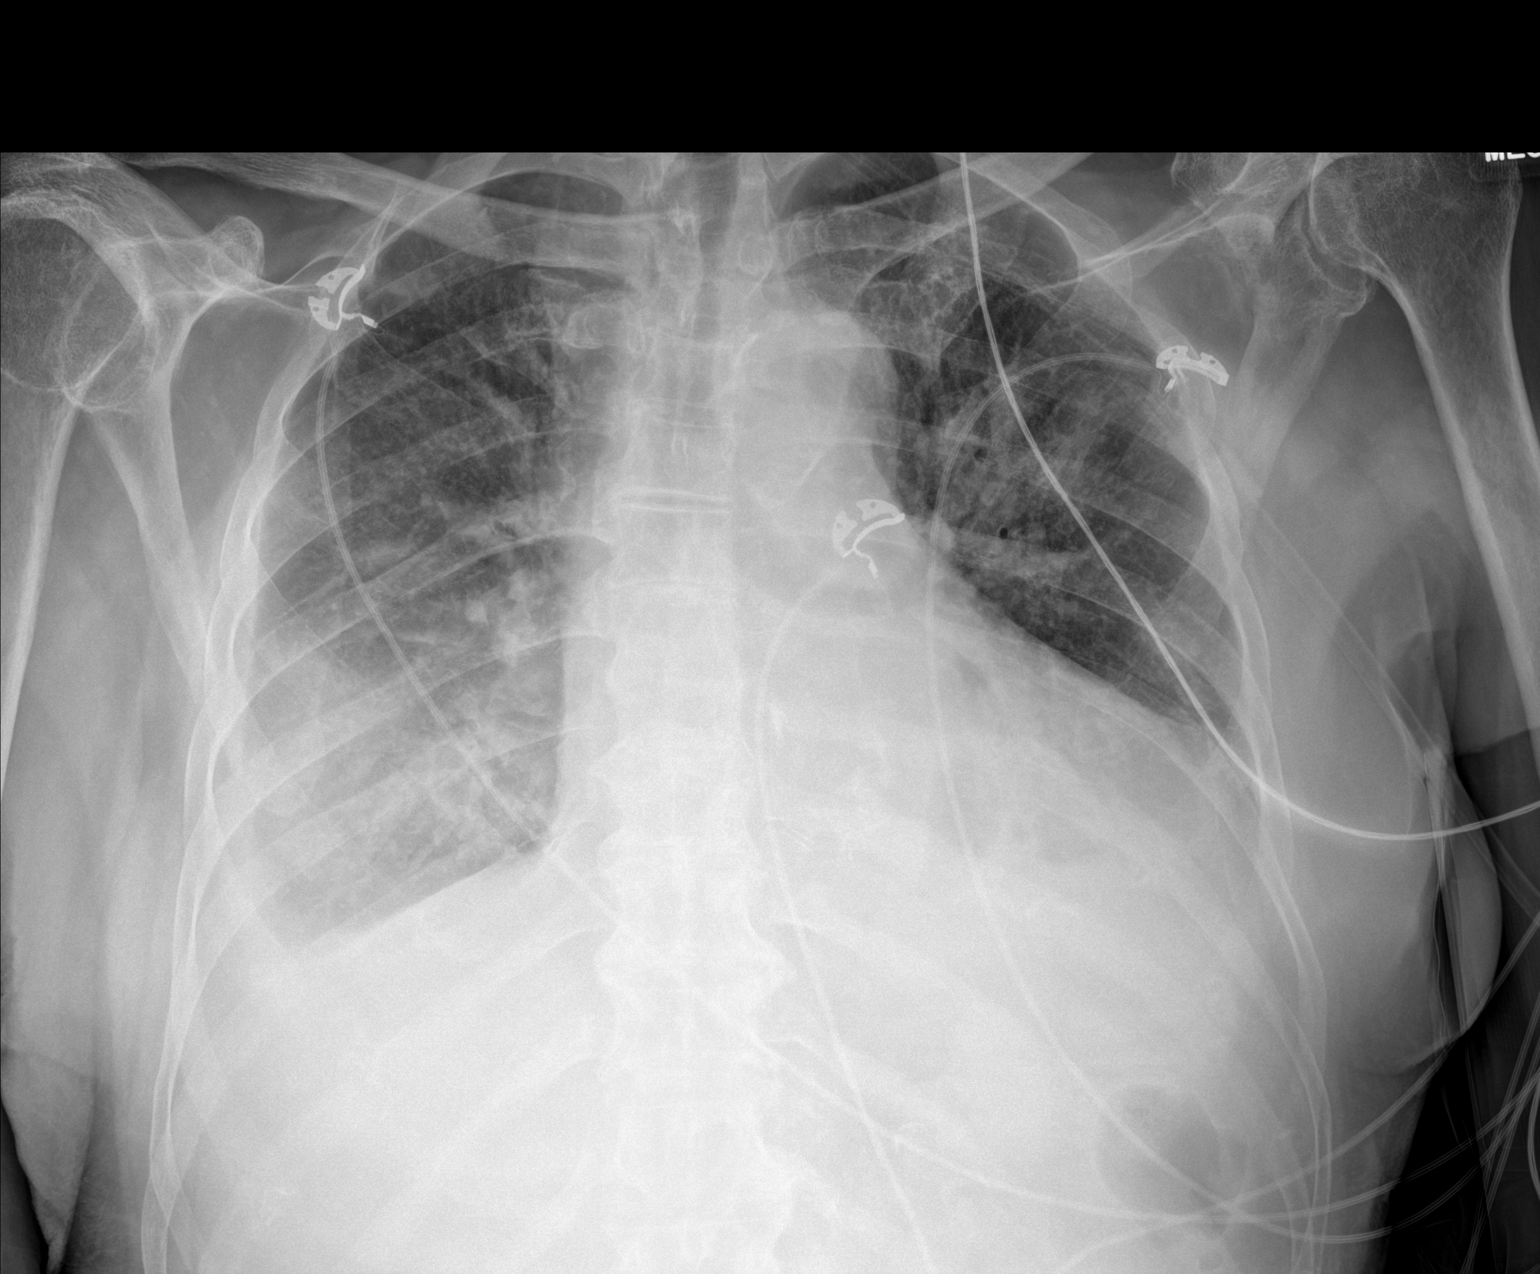

[1 of 1 positions shown; findings below may reference images not displayed]

FINDINGS: Cardiomegaly. Bilateral pulmonary infiltrates/edema and bilateral
pleural effusions. Interim progression from prior exams. No
pneumothorax. Degenerative change thoracic spine.
IMPRESSION: 1.  Cardiomegaly.

2. Bilateral pulmonary infiltrates/edema and bilateral pleural
effusions. Interim progression from prior exam.
